# Patient Record
Sex: Male | Born: 1955
Health system: Southern US, Community
[De-identification: ages and names within clinical notes are randomized; demographics above are authoritative.]

## PROBLEM LIST (undated history)

## (undated) DIAGNOSIS — J309 Allergic rhinitis, unspecified: Secondary | ICD-10-CM

## (undated) DIAGNOSIS — L309 Dermatitis, unspecified: Secondary | ICD-10-CM

## (undated) DIAGNOSIS — S42442A Displaced fracture (avulsion) of medial epicondyle of left humerus, initial encounter for closed fracture: Secondary | ICD-10-CM

## (undated) DIAGNOSIS — I10 Essential (primary) hypertension: Secondary | ICD-10-CM

## (undated) DIAGNOSIS — I480 Paroxysmal atrial fibrillation: Secondary | ICD-10-CM

## (undated) DIAGNOSIS — K219 Gastro-esophageal reflux disease without esophagitis: Secondary | ICD-10-CM

## (undated) DIAGNOSIS — T7840XA Allergy, unspecified, initial encounter: Secondary | ICD-10-CM

## (undated) HISTORY — DX: Allergy, unspecified, initial encounter: T78.40XA

## (undated) HISTORY — DX: Displaced fracture (avulsion) of medial epicondyle of left humerus, initial encounter for closed fracture: S42.442A

## (undated) HISTORY — DX: Dermatitis, unspecified: L30.9

## (undated) HISTORY — DX: Essential (primary) hypertension: I10

## (undated) HISTORY — PX: OTHER SURGICAL HISTORY: SHX169

## (undated) HISTORY — DX: Allergic rhinitis, unspecified: J30.9

## (undated) HISTORY — PX: APPENDECTOMY: SHX54

## (undated) HISTORY — DX: Paroxysmal atrial fibrillation: I48.0

---

## 1999-06-28 ENCOUNTER — Emergency Department (HOSPITAL_COMMUNITY): Admission: EM | Admit: 1999-06-28 | Discharge: 1999-06-28 | Payer: Self-pay | Admitting: Emergency Medicine

## 2000-08-09 ENCOUNTER — Emergency Department (HOSPITAL_COMMUNITY): Admission: EM | Admit: 2000-08-09 | Discharge: 2000-08-09 | Payer: Self-pay | Admitting: Emergency Medicine

## 2000-08-09 ENCOUNTER — Encounter: Payer: Self-pay | Admitting: Emergency Medicine

## 2003-12-16 ENCOUNTER — Ambulatory Visit: Payer: Self-pay | Admitting: Family Medicine

## 2003-12-16 ENCOUNTER — Encounter: Admission: RE | Admit: 2003-12-16 | Discharge: 2003-12-16 | Payer: Self-pay | Admitting: Family Medicine

## 2004-04-10 ENCOUNTER — Emergency Department (HOSPITAL_COMMUNITY): Admission: EM | Admit: 2004-04-10 | Discharge: 2004-04-10 | Payer: Self-pay | Admitting: Emergency Medicine

## 2004-07-21 ENCOUNTER — Ambulatory Visit (HOSPITAL_COMMUNITY): Admission: RE | Admit: 2004-07-21 | Discharge: 2004-07-22 | Payer: Self-pay | Admitting: Specialist

## 2004-07-28 ENCOUNTER — Ambulatory Visit: Payer: Self-pay | Admitting: Family Medicine

## 2004-08-03 ENCOUNTER — Ambulatory Visit: Payer: Self-pay | Admitting: Family Medicine

## 2005-01-17 ENCOUNTER — Ambulatory Visit: Payer: Self-pay | Admitting: Gastroenterology

## 2005-01-26 ENCOUNTER — Ambulatory Visit: Payer: Self-pay | Admitting: Gastroenterology

## 2005-01-26 HISTORY — PX: COLONOSCOPY: SHX174

## 2005-05-30 ENCOUNTER — Ambulatory Visit (HOSPITAL_COMMUNITY): Admission: RE | Admit: 2005-05-30 | Discharge: 2005-05-30 | Payer: Self-pay | Admitting: Specialist

## 2005-06-13 ENCOUNTER — Ambulatory Visit (HOSPITAL_COMMUNITY): Admission: RE | Admit: 2005-06-13 | Discharge: 2005-06-13 | Payer: Self-pay | Admitting: Specialist

## 2005-08-06 ENCOUNTER — Ambulatory Visit: Payer: Self-pay | Admitting: Family Medicine

## 2005-08-17 ENCOUNTER — Ambulatory Visit: Payer: Self-pay | Admitting: Family Medicine

## 2005-09-11 ENCOUNTER — Ambulatory Visit (HOSPITAL_COMMUNITY): Admission: RE | Admit: 2005-09-11 | Discharge: 2005-09-12 | Payer: Self-pay | Admitting: Specialist

## 2006-07-25 DIAGNOSIS — I10 Essential (primary) hypertension: Secondary | ICD-10-CM

## 2006-07-25 DIAGNOSIS — J309 Allergic rhinitis, unspecified: Secondary | ICD-10-CM

## 2006-07-25 HISTORY — DX: Essential (primary) hypertension: I10

## 2006-08-06 ENCOUNTER — Ambulatory Visit: Payer: Self-pay | Admitting: Family Medicine

## 2006-08-06 LAB — CONVERTED CEMR LAB
AST: 23 units/L (ref 0–37)
Alkaline Phosphatase: 45 units/L (ref 39–117)
Basophils Relative: 0.5 % (ref 0.0–1.0)
Bilirubin, Direct: 0.1 mg/dL (ref 0.0–0.3)
Calcium: 9 mg/dL (ref 8.4–10.5)
Chloride: 101 meq/L (ref 96–112)
Cholesterol: 145 mg/dL (ref 0–200)
Creatinine, Ser: 1.2 mg/dL (ref 0.4–1.5)
Eosinophils Relative: 5.7 % — ABNORMAL HIGH (ref 0.0–5.0)
GFR calc Af Amer: 82 mL/min
HDL: 27.1 mg/dL — ABNORMAL LOW (ref >39.0)
Hemoglobin: 14.7 g/dL (ref 13.0–17.0)
LDL Cholesterol: 88 mg/dL (ref 0–99)
Lymphocytes Relative: 29.6 % (ref 12.0–46.0)
MCHC: 35 g/dL (ref 30.0–36.0)
Monocytes Absolute: 0.5 10*3/uL (ref 0.2–0.7)
Monocytes Relative: 8.2 % (ref 3.0–11.0)
Neutro Abs: 3.5 10*3/uL (ref 1.4–7.7)
Protein, U semiquant: NEGATIVE
RBC: 4.87 M/uL (ref 4.22–5.81)
RDW: 12.3 % (ref 11.5–14.6)
Sodium: 137 meq/L (ref 135–145)
TSH: 1.57 microintl units/mL (ref 0.35–5.50)
Total Bilirubin: 0.8 mg/dL (ref 0.3–1.2)
Total CHOL/HDL Ratio: 5.4
Total Protein: 6.3 g/dL (ref 6.0–8.3)
Triglycerides: 151 mg/dL — ABNORMAL HIGH (ref 0–149)
Urobilinogen, UA: 0.2
VLDL: 30 mg/dL (ref 0–40)
pH: 7

## 2006-08-13 ENCOUNTER — Ambulatory Visit: Payer: Self-pay | Admitting: Family Medicine

## 2007-05-22 ENCOUNTER — Ambulatory Visit: Payer: Self-pay | Admitting: Family Medicine

## 2007-05-22 DIAGNOSIS — J069 Acute upper respiratory infection, unspecified: Secondary | ICD-10-CM

## 2007-05-22 HISTORY — DX: Acute upper respiratory infection, unspecified: J06.9

## 2007-08-18 ENCOUNTER — Ambulatory Visit: Payer: Self-pay | Admitting: Family Medicine

## 2007-08-18 LAB — CONVERTED CEMR LAB
ALT: 26 units/L (ref 0–53)
Alkaline Phosphatase: 49 units/L (ref 39–117)
BUN: 20 mg/dL (ref 6–23)
Bilirubin, Direct: 0.1 mg/dL (ref 0.0–0.3)
Blood in Urine, dipstick: NEGATIVE
CO2: 29 meq/L (ref 19–32)
Cholesterol: 144 mg/dL (ref 0–200)
Eosinophils Relative: 5.6 % — ABNORMAL HIGH (ref 0.0–5.0)
Glucose, Bld: 109 mg/dL — ABNORMAL HIGH (ref 70–99)
Hemoglobin: 14.3 g/dL (ref 13.0–17.0)
Ketones, urine, test strip: NEGATIVE
LDL Cholesterol: 73 mg/dL (ref 0–99)
Lymphocytes Relative: 24.2 % (ref 12.0–46.0)
MCV: 88.2 fL (ref 78.0–100.0)
Monocytes Absolute: 0.6 10*3/uL (ref 0.1–1.0)
Monocytes Relative: 8.7 % (ref 3.0–12.0)
Neutro Abs: 4.3 10*3/uL (ref 1.4–7.7)
Neutrophils Relative %: 60.6 % (ref 43.0–77.0)
Nitrite: NEGATIVE
Protein, U semiquant: NEGATIVE
RBC: 4.69 M/uL (ref 4.22–5.81)
RDW: 12.3 % (ref 11.5–14.6)
Sodium: 140 meq/L (ref 135–145)
Specific Gravity, Urine: 1.025
TSH: 1.17 microintl units/mL (ref 0.35–5.50)
Total CHOL/HDL Ratio: 4.5
Total Protein: 6.1 g/dL (ref 6.0–8.3)
Triglycerides: 198 mg/dL — ABNORMAL HIGH (ref 0–149)
Urobilinogen, UA: 0.2
WBC: 7.1 10*3/uL (ref 4.5–10.5)
pH: 5.5

## 2007-08-24 ENCOUNTER — Encounter: Payer: Self-pay | Admitting: Family Medicine

## 2007-08-25 ENCOUNTER — Ambulatory Visit: Payer: Self-pay | Admitting: Family Medicine

## 2007-08-25 DIAGNOSIS — M25559 Pain in unspecified hip: Secondary | ICD-10-CM | POA: Insufficient documentation

## 2007-08-28 ENCOUNTER — Telehealth: Payer: Self-pay | Admitting: Family Medicine

## 2008-04-28 DIAGNOSIS — S60459A Superficial foreign body of unspecified finger, initial encounter: Secondary | ICD-10-CM | POA: Insufficient documentation

## 2008-05-06 ENCOUNTER — Ambulatory Visit: Payer: Self-pay | Admitting: Family Medicine

## 2008-08-12 ENCOUNTER — Ambulatory Visit: Payer: Self-pay | Admitting: Family Medicine

## 2008-08-12 DIAGNOSIS — G501 Atypical facial pain: Secondary | ICD-10-CM

## 2008-08-16 ENCOUNTER — Telehealth: Payer: Self-pay | Admitting: Family Medicine

## 2008-08-20 ENCOUNTER — Ambulatory Visit: Payer: Self-pay | Admitting: Family Medicine

## 2008-08-24 ENCOUNTER — Encounter: Admission: RE | Admit: 2008-08-24 | Discharge: 2008-08-24 | Payer: Self-pay | Admitting: Otolaryngology

## 2008-09-07 ENCOUNTER — Ambulatory Visit: Payer: Self-pay | Admitting: Family Medicine

## 2008-09-07 LAB — CONVERTED CEMR LAB
ALT: 26 units/L (ref 0–53)
AST: 24 units/L (ref 0–37)
Albumin: 3.9 g/dL (ref 3.5–5.2)
Alkaline Phosphatase: 50 units/L (ref 39–117)
Basophils Absolute: 0 10*3/uL (ref 0.0–0.1)
Blood in Urine, dipstick: NEGATIVE
CO2: 31 meq/L (ref 19–32)
Cholesterol: 147 mg/dL (ref 0–200)
Eosinophils Absolute: 0.3 10*3/uL (ref 0.0–0.7)
GFR calc non Af Amer: 73.99 mL/min (ref >60)
HDL: 36.3 mg/dL — ABNORMAL LOW (ref >39.00)
Hemoglobin: 14.5 g/dL (ref 13.0–17.0)
Ketones, urine, test strip: NEGATIVE
LDL Cholesterol: 91 mg/dL (ref 0–99)
Lymphocytes Relative: 30.9 % (ref 12.0–46.0)
Lymphs Abs: 1.5 10*3/uL (ref 0.7–4.0)
MCHC: 34.6 g/dL (ref 30.0–36.0)
Monocytes Absolute: 0.5 10*3/uL (ref 0.1–1.0)
Neutro Abs: 2.7 10*3/uL (ref 1.4–7.7)
Neutrophils Relative %: 52.5 % (ref 43.0–77.0)
Nitrite: NEGATIVE
Potassium: 5 meq/L (ref 3.5–5.1)
Protein, U semiquant: NEGATIVE
RDW: 12.1 % (ref 11.5–14.6)
Sodium: 140 meq/L (ref 135–145)
TSH: 1.31 microintl units/mL (ref 0.35–5.50)
Total Bilirubin: 0.9 mg/dL (ref 0.3–1.2)
Total CHOL/HDL Ratio: 4
Triglycerides: 100 mg/dL (ref 0.0–149.0)
Urobilinogen, UA: 0.2
VLDL: 20 mg/dL (ref 0.0–40.0)
WBC Urine, dipstick: NEGATIVE

## 2008-09-14 ENCOUNTER — Ambulatory Visit: Payer: Self-pay | Admitting: Family Medicine

## 2008-09-15 ENCOUNTER — Encounter (INDEPENDENT_AMBULATORY_CARE_PROVIDER_SITE_OTHER): Payer: Self-pay | Admitting: Otolaryngology

## 2008-09-15 ENCOUNTER — Ambulatory Visit (HOSPITAL_BASED_OUTPATIENT_CLINIC_OR_DEPARTMENT_OTHER): Admission: RE | Admit: 2008-09-15 | Discharge: 2008-09-15 | Payer: Self-pay | Admitting: Otolaryngology

## 2009-02-07 ENCOUNTER — Telehealth: Payer: Self-pay | Admitting: Family Medicine

## 2009-02-09 ENCOUNTER — Encounter: Payer: Self-pay | Admitting: Family Medicine

## 2009-07-04 IMAGING — CR DG HIP (WITH OR WITHOUT PELVIS) 2-3V*L*
3 series · 3 of 3 positions shown · non-contrast
Comparison: None

CLINICAL DATA: Left hip pain for 1 month.  No known injury.

LEFT HIP - COMPLETE 2+ VIEW

[view not recorded (1 of 3)]
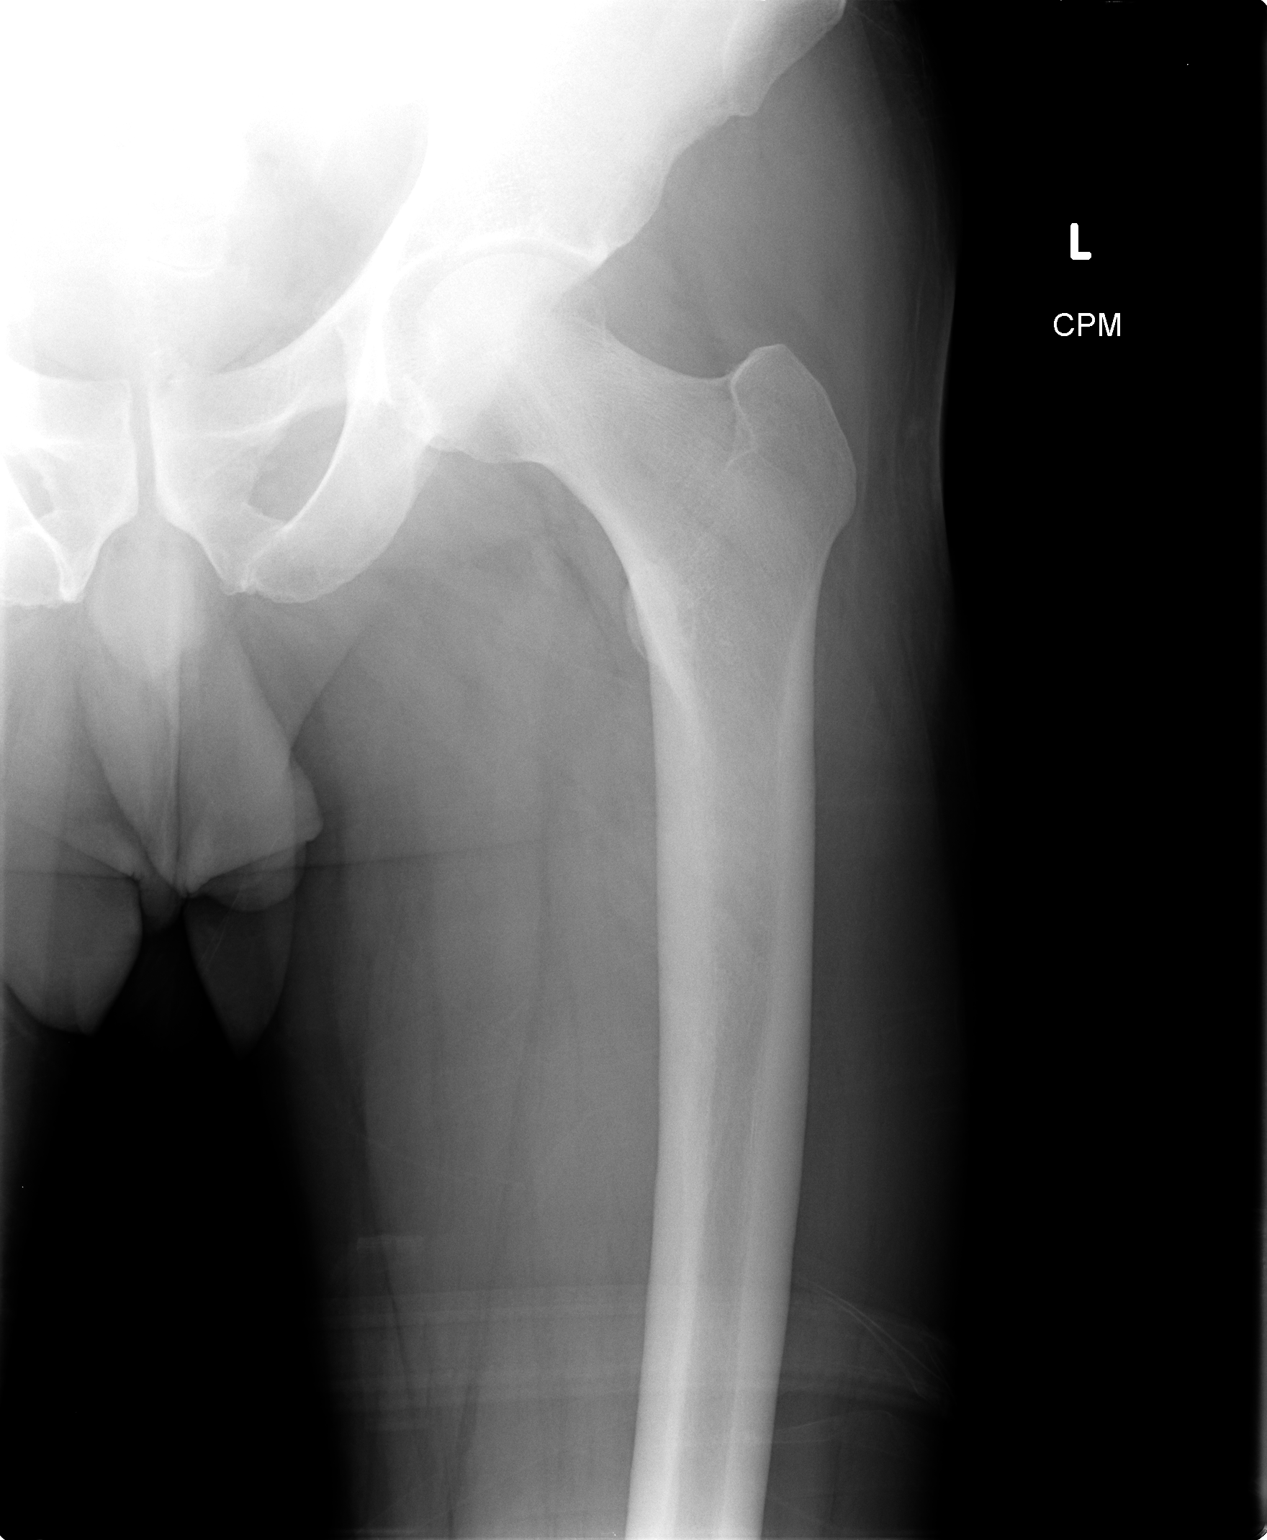

[view not recorded (2 of 3)]
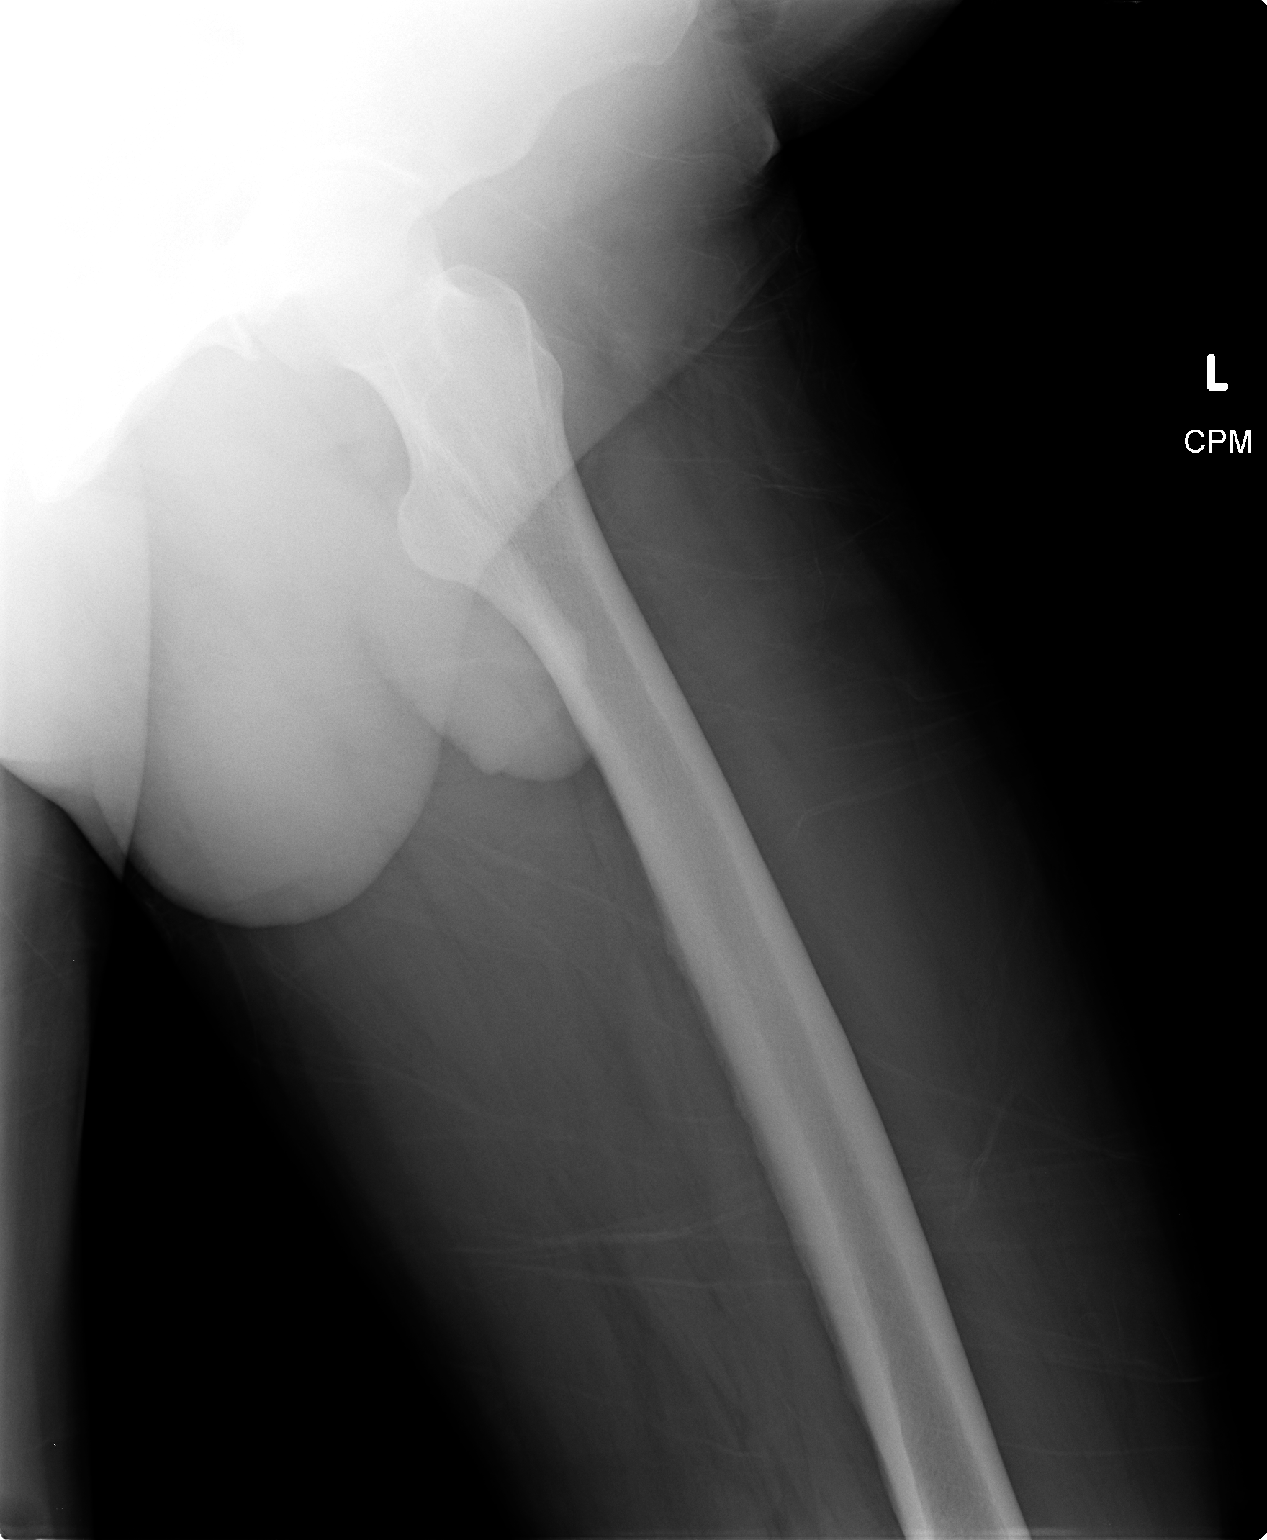

[view not recorded (3 of 3)]
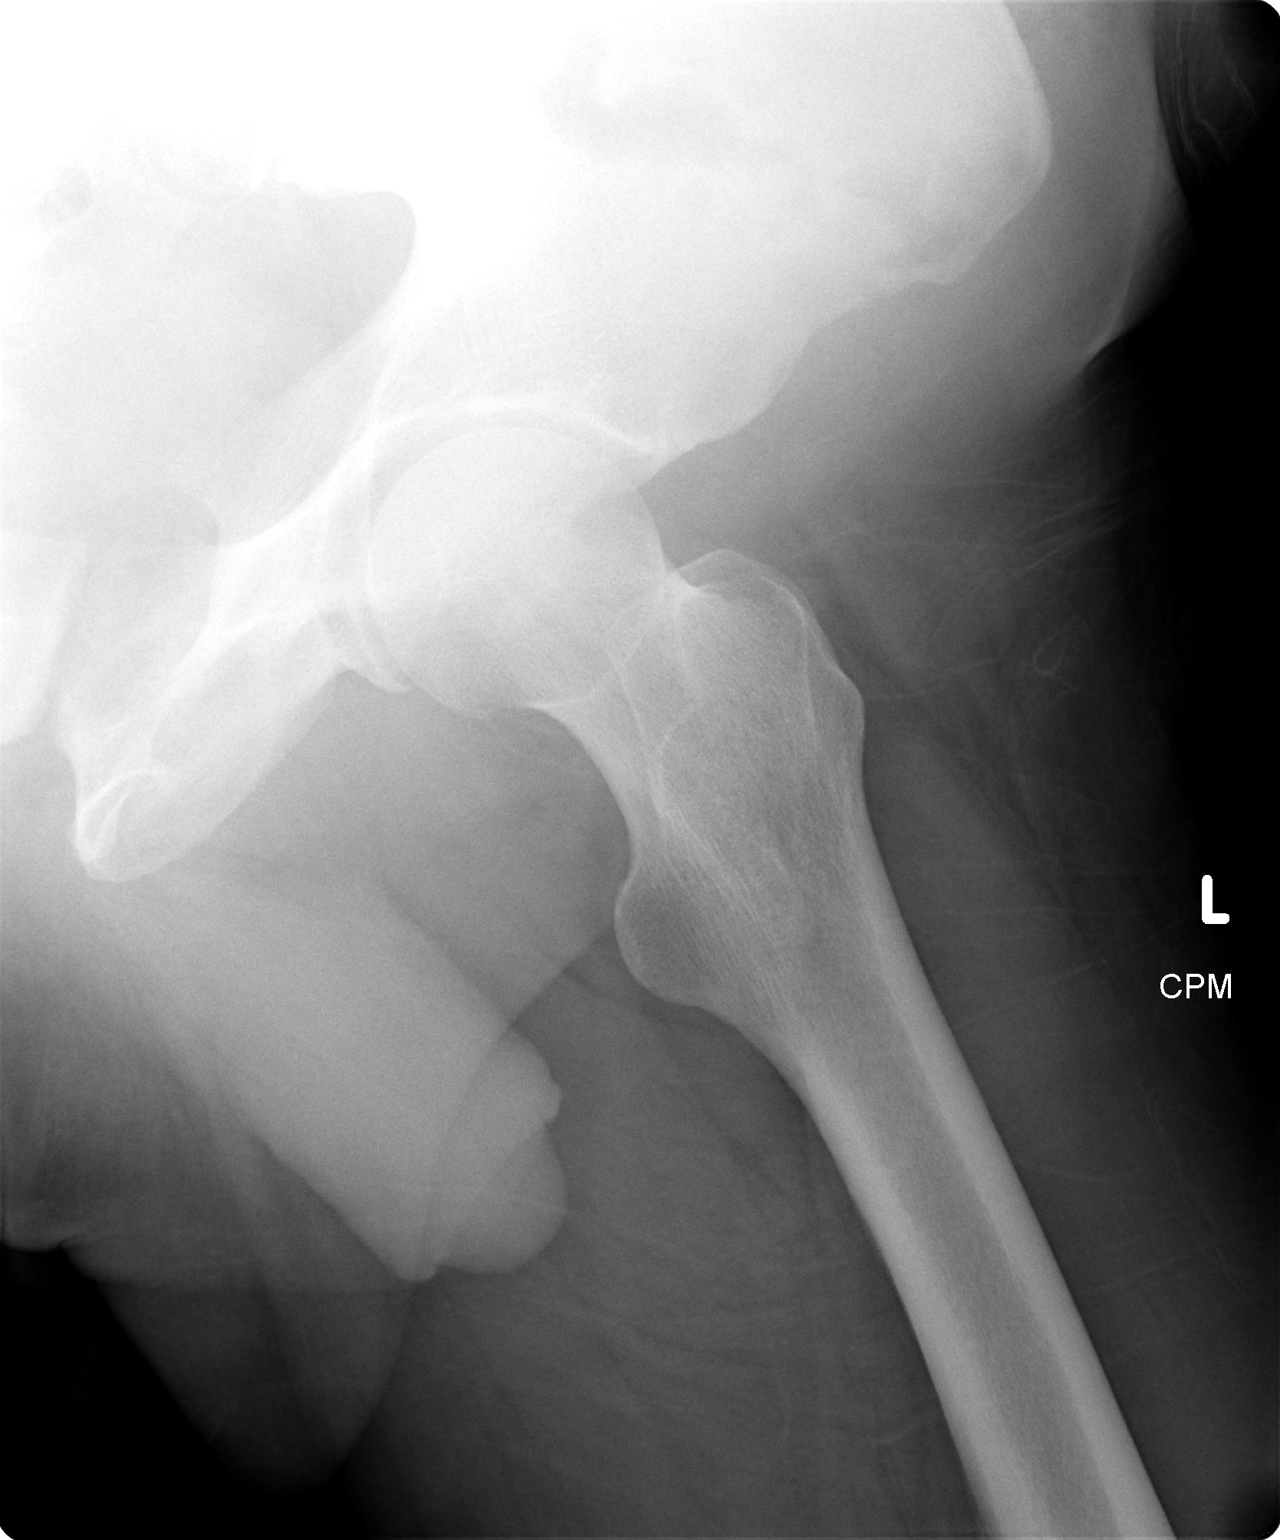

[3 of 3 positions shown; findings below may reference images not displayed]

FINDINGS: No acute bony findings or significant degenerative
changes.  No plain film findings for a fat necrosis.
IMPRESSION: 1.  No acute bony findings or significant degenerative changes.

## 2009-07-11 ENCOUNTER — Telehealth: Payer: Self-pay | Admitting: Family Medicine

## 2009-09-08 ENCOUNTER — Ambulatory Visit: Payer: Self-pay | Admitting: Family Medicine

## 2009-09-08 LAB — CONVERTED CEMR LAB
Basophils Absolute: 0 10*3/uL (ref 0.0–0.1)
Basophils Relative: 0.7 % (ref 0.0–3.0)
Blood in Urine, dipstick: NEGATIVE
Calcium: 9 mg/dL (ref 8.4–10.5)
Chloride: 104 meq/L (ref 96–112)
Creatinine, Ser: 1 mg/dL (ref 0.4–1.5)
Eosinophils Absolute: 0.3 10*3/uL (ref 0.0–0.7)
GFR calc non Af Amer: 82.29 mL/min (ref >60)
HCT: 41.1 % (ref 39.0–52.0)
HDL: 37.5 mg/dL — ABNORMAL LOW (ref >39.00)
Hemoglobin: 14.1 g/dL (ref 13.0–17.0)
Ketones, urine, test strip: NEGATIVE
Lymphocytes Relative: 30.2 % (ref 12.0–46.0)
MCV: 89.8 fL (ref 78.0–100.0)
Monocytes Absolute: 0.5 10*3/uL (ref 0.1–1.0)
Monocytes Relative: 9 % (ref 3.0–12.0)
Neutrophils Relative %: 54.1 % (ref 43.0–77.0)
Platelets: 147 10*3/uL — ABNORMAL LOW (ref 150.0–400.0)
RBC: 4.58 M/uL (ref 4.22–5.81)
Sodium: 138 meq/L (ref 135–145)
Specific Gravity, Urine: 1.025
Total Bilirubin: 0.7 mg/dL (ref 0.3–1.2)
Total CHOL/HDL Ratio: 4
Total Protein: 5.8 g/dL — ABNORMAL LOW (ref 6.0–8.3)
Urobilinogen, UA: 0.2

## 2009-09-15 ENCOUNTER — Ambulatory Visit: Payer: Self-pay | Admitting: Family Medicine

## 2010-02-05 ENCOUNTER — Encounter: Payer: Self-pay | Admitting: Specialist

## 2010-02-14 NOTE — Letter (Signed)
Summary: Generic Letter  [place] [facility]  [address] [place], [place] [address]   Phone: [phone]  Fax: [phone]    02/09/2009  VISHAAL STROLLO [address] [place], [place]  [address]  To Whom it May Concern,  Our patient is authorized to take the following over the counter medications:  Aspirin, Red yeast rice, Zyrtec, Motrin, Claritin, Claritin D, Prilosec, Niacin, Citrcal, and Geritol complete.  If you have any questions or concerns please feel free to call our office.           Sincerely,   Kelle Darting, MD

## 2010-02-14 NOTE — Progress Notes (Signed)
Summary: refill  Phone Note Refill Request Message from:  Fax from Pharmacy on July 11, 2009 1:53 PM  Refills Requested: Medication #1:  ATENOLOL 25 MG  TABS Take 1 tablet by mouth once a day Initial call taken by: Kern Reap CMA Duncan Dull),  July 11, 2009 1:53 PM    Prescriptions: ATENOLOL 25 MG  TABS (ATENOLOL) Take 1 tablet by mouth once a day  #100 x 0   Entered by:   Kern Reap CMA (AAMA)   Authorized by:   Roderick Pee MD   Signed by:   Kern Reap CMA (AAMA) on 07/11/2009   Method used:   [facility]  [address] [phone][facility] (retail[facility]       [address]       [place], [place]  [id number]       Ph: [phone] or [phone]       Fax: [phone]   RxID:   [id number]
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
## 2010-02-14 NOTE — Progress Notes (Signed)
Summary: Pt req letter Berkley Harvey over the counter meds   Phone Note Call from Patient Call back at Home Phone [phone] Call back at [phone] cell   Caller: Patient Summary of Call: Pt called and is req that Dr. Tawanna Cooler write a letter authorizing his over the counter medications. Pt is needing for Insurance purposes.  Initial call taken by: Lucy Antigua,  February 07, 2009 12:45 PM  Follow-up for Phone Call        Fleet Contras please call to clarify the issues Follow-up by: Roderick Pee MD,  February 07, 2009 3:21 PM  Additional Follow-up for Phone Call Additional follow up Details #1::        letter mailed  Additional Follow-up by: Kern Reap CMA Duncan Dull),  February 09, 2009 1:42 PM    New/Updated Medications: ZYRTEC HIVES RELIEF 10 MG TABS (CETIRIZINE HCL) once daily MOTRIN IB 200 MG TABS (IBUPROFEN) take one tab two times a day CLARITIN 10 MG TABS (LORATADINE) take one tab once daily as needed CLARITIN-D 24 HOUR 10-240 MG XR24H-TAB (LORATADINE-PSEUDOEPHEDRINE) take one tab once daily PRILOSEC 20 MG CPDR (OMEPRAZOLE) once daily CITRACAL PLUS  TABS (MULTIPLE MINERALS-VITAMINS) once daily GERITOL COMPLETE  TABS (IRON-VITAMINS) once daily

## 2010-02-20 ENCOUNTER — Telehealth: Payer: Self-pay | Admitting: *Deleted

## 2010-02-20 DIAGNOSIS — I10 Essential (primary) hypertension: Secondary | ICD-10-CM

## 2010-02-20 NOTE — Telephone Encounter (Signed)
Pt is asking for Cozaar back as his BP is running up in the 150's /80's [facility] [address].

## 2010-02-21 ENCOUNTER — Telehealth: Payer: Self-pay | Admitting: *Deleted

## 2010-02-21 ENCOUNTER — Other Ambulatory Visit: Payer: Self-pay | Admitting: *Deleted

## 2010-02-21 DIAGNOSIS — I10 Essential (primary) hypertension: Secondary | ICD-10-CM

## 2010-02-21 MED ORDER — LOSARTAN POTASSIUM 50 MG PO TABS: 50.0000 mg | ORAL_TABLET | Freq: Every day | ORAL | Status: DC

## 2010-02-21 NOTE — Telephone Encounter (Signed)
Returning Rachel's call

## 2010-02-21 NOTE — Telephone Encounter (Signed)
Spoke with patient.

## 2010-02-21 NOTE — Telephone Encounter (Signed)
First rx printed in error

## 2010-02-21 NOTE — Telephone Encounter (Signed)
Okay to restart the Cozaar, question dose???????? Dispense hundred tabs directions one daily.  Refills x 3.  Asked him to take a blood pressure daily in the morning.  If after 3 weeks.  His blood pressure is normal, then continue the current dose.  If, however, blood pressures not normal, come see [place] with the data and the device for reevaluation

## 2010-02-21 NOTE — Telephone Encounter (Signed)
Spoke with patient and rx sent 

## 2010-02-21 NOTE — Telephone Encounter (Signed)
Left message on machine for patient   Returning his call.

## 2010-04-22 LAB — BASIC METABOLIC PANEL
BUN: 17 mg/dL (ref 6–23)
Chloride: 101 mEq/L (ref 96–112)
Creatinine, Ser: 0.97 mg/dL (ref 0.4–1.5)
GFR calc Af Amer: >60 mL/min (ref >60)
GFR calc non Af Amer: >60 mL/min (ref >60)
Glucose, Bld: 88 mg/dL (ref 70–99)
Potassium: 4 mEq/L (ref 3.5–5.1)

## 2010-05-30 NOTE — Op Note (Signed)
NAME:  Alvin Morrison, Alvin Morrison              ACCOUNT NO.:  [id number]   MEDICAL RECORD NO.:  [id number]          PATIENT TYPE:  AMB   LOCATION:  [facility]                          FACILITY:  [facility]   PHYSICIAN:  Kristine Garbe. Ezzard Standing, M.D.DATE OF BIRTH:  01-19-1955   DATE OF PROCEDURE:  09/15/2008  DATE OF DISCHARGE:                               OPERATIVE REPORT   PREOPERATIVE DIAGNOSES:  Chronic right maxillary right ethmoid sinus  disease.  Septal deviation to the right with turbinate hypertrophy.   POSTOPERATIVE DIAGNOSES:  Chronic right maxillary right ethmoid sinus  disease.  Septal deviation to the right with turbinate hypertrophy.   OPERATION PERFORMED:  Septoplasty.  Posterior endoscopic sinus surgery  with total right ethmoidectomy.  Right maxillary ostial enlargement with  irrigation of right maxillary sinus.  Simple bilateral inferior  turbinate reductions.   SURGEON:  Kristine Garbe. Ezzard Standing, MD   ANESTHESIA:  General endotracheal.   COMPLICATIONS:  None.   BRIEF CLINICAL NOTE:  Dictation ended at this point.           ______________________________  Kristine Garbe. Ezzard Standing, M.D.     CEN/MEDQ  D:  09/15/2008  T:  09/16/2008  Job:  [id number]   cc:   Tinnie Gens A. Tawanna Cooler, MD
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
## 2010-06-02 NOTE — Op Note (Signed)
[place]  Patient:    Alvin Morrison, Alvin Morrison                     MRN: [id number] Proc. Date: 06/28/99 Adm. Date:  [id number] Disc. Date: [id number] Attending:  Lorre Nick                           Operative Report  PREOPERATIVE DIAGNOSES: 1. A 5.5 cm complex upper lip laceration with abrasions secondary to dog bite. 2. Multiple facial abrasions secondary to dog bite.  POSTOPERATIVE DIAGNOSES: 1. A 5.5 cm complex upper lip laceration with abrasions secondary to dog bite. 2. Multiple facial abrasions secondary to dog bite.  PROCEDURES: 1. Debridement and repair of a 5.5 cm complex upper lip laceration and    abrasions. 2. Debridement of facial abrasions.  SURGEON:  Mary A. Contogiannis, M.D.  ASSISTANT:  ANESTHESIA:  One percent lidocaine with epinephrine.  COMPLICATIONS:  None.  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  As a result, he has a complex stellate laceration to upper lip along with abrasions in that area.  He also has abrasions on the lower lip and chin area.  I was consulted by the ER staff to evaluate and repair the lacerations.  DESCRIPTION OF PROCEDURE:  The patient was lying on the stretcher in the ER. The face was prepped with Betadine and draped in a sterile fashion.  The skin and subcutaneous tissues around the area of the upper lip laceration and abrasions were then injected with 1% lidocaine with epinephrine.  After adequate hemostasis and anesthesia had taken effect, the procedure was begun. The laceration was complex and did go into the orbicularis oris musculature. The laceration was stellate.  Many of the edges were contused and abraded. There were actually a few small skin flaps as well.  All of this area of laceration again was stellate with many arms to it.  The  laceration was sharply debrided and then irrigated thoroughly with saline irrigation.  The wound was closed in multiple layers, first reapproximating the orbicularis oris musculature with 3-0 interrupted Monocryl sutures.  Next the subcutaneous and subdermal layers were repaired using 4-0 and 5-0 Monocryl as appropriate. The skin edges were then closed using 6-0 Prolene in a running baseball stitch, as well as interrupted sutures.  There were some areas of abrasion adjacent to this area which were debrided.  The patient also had abrasion-type lacerations on the lower lip and upper chin area which were also debrided. The repaired lacerations were then dressed with bacitracin ointment.  There were no complications.  The patient tolerated the procedure well.  As I discussed with the patient at the time of surgery, part of the skin edges were very contused and there were actually a couple of areas of small skin flaps. These were placed back down and sewn in place and hopefully they will take. It is possible, however, that they may not take as well.  The patient was discharged home on p.o. Augmentin.  A follow-up appointment will be tomorrow in the office. DD:  06/28/99 TD:  07/03/99 Job: [id number]

## 2010-06-02 NOTE — Op Note (Signed)
NAME:  Alvin Morrison, Alvin Morrison              ACCOUNT NO.:  [id number]   MEDICAL RECORD NO.:  [id number]          PATIENT TYPE:  INP   LOCATION:  5005                         FACILITY:  [facility]   PHYSICIAN:  Kerrin Champagne, M.D.   DATE OF BIRTH:  [birth year]   DATE OF PROCEDURE:  09/11/2005  DATE OF DISCHARGE:                                 OPERATIVE REPORT   PREOPERATIVE DIAGNOSIS:  Nonunion, left mid shaft humerus fracture status  post intramedullary nail, status post dynamization of intramedullary nail.   POSTOPERATIVE DIAGNOSES:  Nonunion, left mid shaft humerus fracture status  post intramedullary nail, status post dynamization of intramedullary nail.   PROCEDURE:  Left upper extremity removal of humeral IM nail, then takedown  of a nonunion left mid shaft humerus fracture, and ORIF of left humerus  fracture with an anterior 6-hole 3.5 DCP and a lateral 6-hole 3.5 DCP with  local bone graft.   SURGEON:  Dr. Vira Browns.   ASSISTANT:  Wende Neighbors, P.A.C.   ANESTHESIA:  General via orotracheal intubation, Dr. Diamantina Monks.  Local  infiltration with Marcaine over the upper shoulder incision over the lateral  arm.   SPECIMENS:  Nonunion material sent for culture and sensitivity.   ESTIMATED BLOOD LOSS:  50 mL   COMPLICATIONS:  None.   TOTAL TOURNIQUET TIME:  250 mmHg, 1 hour 37 minutes.   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  He was treated initially conservatively for both a mid shaft  humerus fracture and a medial condyle fracture which was nondisplaced.  The  fracture was treated with coaptation splint, eventually transferred to a  functional brace.  The medial condyle fracture went on to heal uneventfully.  The mid shaft humerus fracture continued with motion to fracture site  despite the use of functional bracing, with excellent return of function  of  the left elbow.  He was taken to the operating room, underwent an open  reduction and internal fixation with an intramedullary nail using both  proximal and distal interlocking screws.  After a period of 6 months, this  did not seem to allow for healing at the fracture site.  He underwent  dynamization of the rod with removal of both proximal and distal  interlocking screws.  This also was unsuccessful in eliciting strong bony  healing response.  He is now nearly 56 months out from the time of removing  his interlocking screws without any evidence of significant improvement in  bone stability.  He is brought back to the operating room today to undergo  removal of the intermedullary nail, takedown nonunion of the humerus with  plate fixation and bone grafting.  Initially planned for iliac crest bone  graft on the left side.  At the time of surgery, he was found to have enough  abundant bone material that was resected at the nonunion site to allow for  bone grafting locally.   DESCRIPTION OF PROCEDURE:  After adequate general  anesthesia, the patient  had a roll placedunder the left buttocks, a roll placed under the left  scapula and shoulder, elevating him on the left side in a supine position.  A hand table was used.  Standard prep with DuraPrep solution over the left  iliac crest anterolaterally and over the left parascapular region, axillary  area around the shoulder and the base of the neck laterally on the left  side, on the left circumferential arm and elbow and forearm to the wrist.  He was draped in the usual manner.  Iodine VI-Drape was used to exclude the  axillary area in the upper arm. VI-Drape was used over the left iliac crest.   An initial incision was made over the left upper arm in line with the  previous incision used for placing the IM nail proximally.  This is an  incision just off the lateral aspect of the acromion, approximately 3-4 cm  in length, through the  skin and subcutaneous layers, directly down to the  deltoid.  This was split in line with the previous scar tissue, down to the  subdeltoid bursa.  The rotator cuff was identified.  The interval previous  incision previous sutures were removed and an incision made into the rotator  cuff in line with its fibers.  A Hemostat was then used to localize the  opening at the medial border of the greater tuberosity at the osteochondral  border of the humeral head laterally where the nail was present.  Curettage  then performed in order to expose the proximal end of the nail.  With  adduction of the arm and slight anterior flexion, the nail was easily  brought into line with the opening.  Then using a tapered, threaded, easy-  out type of Universal nail extractor, the nail was able to be extracted  approximately 5 mm without removal of the inner medullary portion or the  internal cap.  We were able to then insert an insertion guide pin and  further extract the nail an additional 2-3 cm, then able to grasp it with a  pair of pliers, and then carefully toggling the nail and carefully backing  it out without difficulty.  The nail was then removed.  Irrigation was  performed in the rotator cuff area in the shoulder joint.   Inspection demonstrated the anterior and posterior flaps of rotator cuff to  be in good condition.  These were approximated with interrupted 0 and #1  Ethibond sutures in a simple fashion.  Deltoid was then re-approximated in  the subcu areas with interrupted 0 Vicryl sutures.  The subcu layer was  approximated with interrupted 2-0 Vicryl sutures and stainless steel staples  then used to close the incision over the left lateral shoulder.  Next, the  left lateral arm was then approached.   The old previous incision scar at the interval of the biceps muscle and  brachialis laterally, about the mid portion of the humerus incision, then to be drawn and in line with the lateral aspect  of the humerus and carried  along the anterior border of the deltoid proximally and along the anterior  border of the brachioradialis muscle distally.  The arm had a sterile  tourniquet placed about the upper arm.  Protecting the skin with  stockinette.  Exsanguination with Esmarch bandage, tourniquet inflated to  250 mmHg.   An incision approximately 6 inches in length along the lateral aspect of the  humerus through the skin and subcutaneous  layers down to  the brachialis  muscle.  This was incised in line with the previous incision scar, then  carried down to the lateral aspect of the humerus over the mid shaft of the  humerus.  The fracture site identified, the incision then carried distally  through the brachialis muscle, continuing distally to near the area of the  brachialis muscle.  Here, Metzenbaum scissors were then used to carefully  spread the tissues to expose the radial nerve in its distal approach to the  anterolateral aspect of the humerus.  Nerve was identified and protected  here.  Subperiosteal dissection was then carried about the fracture side,  exposing both the proximal and distal ends of the humeral fracture site.  Abundant calluses present.  There was also abundant scar tissue interposed  at the fracture site.  This was debrided circumferentially.  Medially, the  vasculature and blood vessels were protected.   The fracture did not appear to interdigitate with any anatomic alignment.  Therefore, the expected anterior surface of the humerus was carefully lined  up and electrocautery used to make a line for alignment and rotation.  This  was then marked further with an indelible ink marker pen in order to  determine rotation.  Next, carefully the ends of the humeral fracture site  were carefully rongeured in order to allow for them to meet and to  interdigitate with good bone-on-bone contact.  There was a slight oblique  fragment extending medially and this was  carefully trimmed and smoothed, as  was the more proximal fracture end fragments in order to set this area and  allow for good bone-on-bone contact.  Excellent bone-on-bone contact was  obtained medial, anterior, lateral, and posterior.  The fracture, however,  had some slight apex lateral angular deformity, about 10 degrees maximum.  With this, then a 6-hole 4.5 DCP was applied to the anterior surface of the  humerus fracture site and three holes proximal and three holes distal.  Large bone holding forceps including bougied distally and bone holding  forceps proximally were used to hold the blade to bone and reduce the  fracture.  Three proximal screw holes were then filled, each drilled with  3.5 drill bit measured for depth and appropriate screw placed, obtaining  excellent fixation of plate proximally.   Next, the plate and the fracture site were carefully aligned and then fixing the distal fragment to the distal portions of the plate.  C-arm fluoro was  used to ascertain the reduction, indicating some slight apex lateral angular  deformity but excellent bone-on-bone contact.  We accepted the small degree  of angular deformity lateral because of the excellent bone-on-bone contact.  With this, then drill holes were placed distally and DCP action of the plate  were used by performing drill holes in the more distal portion of the screw  holes.  Then 3.5 screw holes were placed, first more proximal to the three  distal holes, and then appropriate screws were placed, obtaining DCP action  across the fracture site each, loosening the previous screw and placing the  next more distal screw and obtaining even further DCP action with each  successive screw.  With this, then the plate was nicely fixed to the  anterior surface of the humerus and the AP plane.  Patient had minimal apex  lateral angular deformity.  A rongeur was used to carefully rongeur the  lateral surface of the humerus near the  fracture site to decrease some of  the lateral bow here.  I obtained further bone graft that was interdigitated  in the fracture site which was very little room.  However, this was still  intermixed as best as possible.  A 6-hole 3.5 DCP plate was then contoured  to the lateral aspect of the humerus, right against this lateral surface,  and then drill holes placed, making sure that each of the screws carefully  crossed between the holes of the previous EPUs anteriorly.  Then 3.5 screws  were used to fix this plate to the lateral aspect of the humerus, obtaining  excellent fixation in both the anterior and lateral planes.   Additional bone graft that had been removed with the debridement of the  nonunion site was then carefully applied circumferentially about the area of  the fixed humerus.  The tourniquet was released.  Careful inspection showed  some very small bleeders that were controlled with bipolar electrocautery  along the anterior aspect to the brachialis muscle.   Irrigation had been performed prior to bone grafting to assure that the bone  graft remained in the present position of alignment.  Medium Hemovac drain  placed in the depth of the incision, exiting anterolaterally over the  proximal arm, distal to the deltoid.  The superficial fascial layer of the  brachialis muscle then used to approximate the brachialis muscle with  interrupted 0 Vicryl sutures.  The subcu layer was approximated with  interrupted 2-0 Vicryl suture, and the skin closed with stainless steel  staples.  Adaptic 4 x 4's, ABD pads fixed to the skin with Webril, and then  a well-padded long-arm bulky dressing was applied from the left hand to the  left upper arm.  Adaptic 4 x 4 fixed to the skin over the left shoulder  area.  The patient was then placed into a sling, reactivated, returned to  the recovery room in satisfactory condition.  All instrument and sponge  counts were correct.      Kerrin Champagne, M.D.  Electronically Signed     JEN/MEDQ  D:  09/11/2005  T:  09/11/2005  Job:  [id number]

## 2010-06-02 NOTE — Op Note (Signed)
NAME:  Alvin Morrison, Alvin Morrison              ACCOUNT NO.:  [id number]   MEDICAL RECORD NO.:  [id number]          PATIENT TYPE:  OIB   LOCATION:  5015                         FACILITY:  [facility]   PHYSICIAN:  Kerrin Champagne, M.D.   DATE OF BIRTH:  1955/11/16   DATE OF PROCEDURE:  07/21/2004  DATE OF DISCHARGE:                                 OPERATIVE REPORT   PREOPERATIVE DIAGNOSIS:  Nonunion left distal and middle 1/3 humeral shaft  fracture, healed medial condyle fracture, nondisplaced, involving the left  elbow.   POSTOPERATIVE DIAGNOSIS:  Nonunion left distal and middle 1/3 humeral shaft  fracture, healed medial condyle fracture, nondisplaced, involving the left  elbow.   PROCEDURE:  Open reduction and internal fixation of left distal middle 1/3  humeral shaft fracture nonunion using a Stryker 280 mm by 8 mm humeral  trauma nail with proximal and distal interlocking screws.   SURGEON:  Kerrin Champagne, M.D.   ASSISTANT:  Wende Neighbors, P.A.-C.   ANESTHESIA:  GOT, Dr. Diamantina Monks.   ESTIMATED BLOOD LOSS:  None.   DRAINS:  None.   TOURNIQUET TIME:  Left upper extremity 250 mmHg 25 minutes.   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  He was seen initially in the emergency room  at [facility], placed into a coaptation splint, followed up in the office  demonstrating adequate alignment of the humeral shaft fracture, was treated  with a functional splint for the proximal fracture site and the distal  middle 1/3 of the humerus and treated with first, splinting, then an elbow  hinged brace with a functional brace for the humerus.  Over the last two  months, he has developed persistent motion at the humeral  fracture site  involving the shaft and has gone on to heal the medial condyle fracture  site.  He is brought to the operating room after a follow up evaluation  demonstrated the fracture fragments to be widely displaced with developing a  flask like calcifications indicating a developing nonunion.   OPERATIVE FINDINGS:  As above.   DESCRIPTION OF PROCEDURE:  The patient was placed in a Shling shoulder frame  following general anesthetic, standard preoperative antibiotics, placed into  a beach chair position.  The patient underwent a standard prep with DuraPrep  solution from the left wrist to the left axillary region and circumferential  about the entire arm over the anterior proximal chest over the superior  aspect of the shoulder and lateral neck and over the posterior scapular  region and axillary area.  It was draped in the usual manner.  Iodine Vi-  Drape was used over the axillary area at its approximation to the drapes.  The patient initially had a sterile tourniquet about the left upper arm  placed and inflated  to 250 mmHg.  An incision was made at the interval of  the distal middle 1/3 of the humerus anterolaterally through the skin and  subcutaneous layers incising the superficial fascia between the brachialis  muscle and the biceps muscle laterally and then blunt dissection down to  bone using Metzenbaum scissors.  Here, callus was encountered, this was  spread using Metzenbaum scissors at the fracture site debriding soft tissue  and scar tissue interposed between the two fractured ends.  There was  apparent small amounts of fibrocartilage present that suggested the early  development of nonunion.  The distal intramedullary canal was identified  using a curet and opened with a curet.  The proximal canal was identified.  Note, the tourniquet required removal for further exposure of the proximal  fracture fragment ends to allow for approximation of the fracture fragment  ends.   An incision was then made over the lateral aspect of the left  shoulder extending from the acromion process laterally distally in line with  the deltoid incision approximately 4 to 4.5 cm in length through the skin  and subcutaneous layers incising the patient's deltoid muscle over the  greater tuberosity laterally and extending down to the subacromial space.  The greater tuberosity was identified, self-retaining retractor was placed.  An incision was then made in line with the patient's supraspinatus tendon  and rotator cuff directly over the greater tuberosity extending proximally.  This area was then spread and an awl introduced medial to the greater  tuberosity by about 0.5 cm and lateral to the biceps tendon.  It was entered  into the humeral head in line with the humeral shaft.  A guide pin was then  passed through this crossing the intramedullary canal proximally and then  across the fracture site.  Placing the guide pin across the fracture site  was not easy and required bending the tip of the intramedullary guide pin  then manipulating the fracture site and eventually opening the distal  intramedullary canal further until it accepted the guide pin.  Once the pin  was passed, reaming could be begun, first an initial measurement was  obtained for approximation of length measuring slightly greater than 285 mm.  Initially, 270 mm length nail was chosen, however, this nail ended up  having difficulties with insertion and removal of the proximal compression  screw causing the nail to be abandoned.  A 280 mm length by 8 mm nail was  chosen.  Reaming was begun at 7.5 mm and brought up to 8.5 then 9.5 mm in  0.5 mm increments from 8.5 mm.  Reaming was performed all the way to the tip  of the intramedullary guide pin which was about 2 cm above the olecranon  fossa of the intramedullary canal.  Once this was completed, then exchange was made of the intramedullary guide pin using a plastic  sleeve, placing the  more thin intramedullary guide for placement of the IM nail.  An 8 mm by 280  mm nail was then inserted into place and impacted down obtaining good  approximation of the fracture site.  Due to the nature of the obliquity of  the fracture site and some degree of displacement that was residual, it was  felt that compression was necessary.  In order to perform this, then, distal  interlocking screw was necessary and this was done by carefully bringing in  the C-arm fluoroscopy under direct visualization determining the proper  orientation of the distal interlocking screw  hole.  The screw hole to be  used was in the anterior posterior plane, a hemostat was used to mark the  skin overlying the opening of the distal end of the locking hole.  A stab  incision was made using a 15 blade scalpel through the skin and subcu layers  and a hemostat used to spread the deep layers down to bone.  With this,  then, the 4 mm screw drill bit which was a 3.5 drill bit was placed against  the anterior cortex and using the C-arm fluoroscopy used to perform free  hand drilling through the anterior cortex through the nail and then across  the posterior cortex.  After crossing the posterior cortex, length was  obtained at 25 mm.  The screw that attached to a screw driver that was able  to hold a distal interlocking screw was placed free hand and the screw to  cross the distal interlocking nail screw hole capturing both the anterior  and posterior cortex of the distal humerus.  This completed fixation  distally.  Returning proximally, the sleeve used to place the nail was then  used for placement of a guide for the proximal interlocking screw.  This was  then placed, counter incision made, and this was passed down to bone bluntly  using a hemostat again to spread soft tissues down to bone.  Again, the  proximal humerus was drilled and a 45 mm screw chosen which was partially  threaded.  This was  then passed and provided excellent fixation proximally.  This was placed within the [employer] screw hole.  An attempt was then made to  perform compression proximally and the compression screw was placed into the  proximal portion of the nail after removal of the insertion sleeve.  After  attempts at trying to obtain compression, this was abandoned and it was felt  that the screw had most likely stripped into its position alignment and no  longer was maintaining any compression.  However, the fracture site itself  appeared to be well approximated in both AP and lateral planes and  demonstrated excellent rotation and stability.  Attention was turned to  irrigation of all the incisions and closure.  The area of the incision in  the rotator cuff was approximated with interrupted #1 Ethibond sutures.  The  deltoid muscle and superficial fascia was reapproximated with interrupted 0 Vicryl sutures, the more superficial layers with interrupted 2-0 Vicryl  sutures, and the skin closed with stainless steel staples.  The stab  incision for the proximal interlock screw was closed with subcu sutures of 2-  0 Vicryl and skin with staples.  The lateral incision used for ORIF and open  reduction of the humerus fracture site was irrigated, the fracture layer at  the interval between the brachialis muscle and the biceps muscle  reapproximated with interrupted 0 and 2-0 Vicryl sutures used to close the  subcu layers and the skin was closed with stainless steel staples.  A stab  incision was closed in the subcu layers with interrupted 2-0 Vicryl sutures  and standard steel staples.  4 by 4s were affixed to the skin with hypofix  tape over Adaptic and then a well padded posterior splint applied from the  left hand to the upper arm.  The patient was then reactivated, extubated,  and returned to the recovery room in satisfactory condition.  All instrument  and sponge counts were correct.  Note that permanent C-arm  images were  obtained in the AP and lateral planes, fracture site, as well as the  proximal humerus to document fixation here.       JEN/MEDQ  D:  07/21/2004  T:  07/21/2004  Job:  [id number]

## 2010-06-02 NOTE — Op Note (Signed)
NAME:  Alvin Morrison, Alvin Morrison              ACCOUNT NO.:  [id number]   MEDICAL RECORD NO.:  [id number]          PATIENT TYPE:  AMB   LOCATION:  SDS                          FACILITY:  [facility]   PHYSICIAN:  Kerrin Champagne, M.D.   DATE OF BIRTH:  August 03, 1955   DATE OF PROCEDURE:  06/13/2005  DATE OF DISCHARGE:  06/13/2005                                 OPERATIVE REPORT   PREOPERATIVE DIAGNOSIS:  Draining incision left lateral arm status post  application of hydroxyapatite to humeral fracture site.   POSTOPERATIVE DIAGNOSIS:  Draining incision left lateral arm status post  application of hydroxyapatite to humeral fracture site.   PROCEDURE:  Incision excisional debridement of open wound left lateral arm,  status post placement of hydroxyapatite to the left humerus fracture site  with primary closure over TLS drain.   SURGEON:  Kerrin Champagne, M.D.   ASSISTANT:  None.   ANESTHESIA:  Maren Beach, M.D.   ESTIMATED BLOOD LOSS:  15 mL.   SPECIMENS:  Tissue sent for culture and sensitivity as well as swabs for  both anaerobic and aerobic cultures and sensitivities.   COMPLICATIONS:  None.   DRAINS:  The patient had a drain placed, 10-French TLS drain to red top too.  The patient returned to the PACU in good condition.   HISTORY OF PRESENT ILLNESS:  The patient is a 55 year old male who had a  left mid distal humeral shaft fracture associated with medial condyle  fracture left distal humerus, occurred nearly a year ago.  He was treated,  initially, with functional brace and went on to heal his medial condyle  fracture, but he persisted with motion at the fracture site of his distal  middle 1/3 of his humerus.  This was treated, initially, with functional  bracing and demonstrated no healing after several months; and then went on  to delayed and nonunion.  He was treated with an open reduction with IM  nailing using both proximal and distal interlocking screws.  He formed  callus,  gradually, over the medial posterior aspects of fracture site but  still had difficulty going on to heal the lateral aspect the fracture site  still with gross motion.  Because of this, he was taken the operating room 2  weeks ago; and underwent removal of both proximal and distal interlocking  screws to dynamize the fracture site; and then allow it to have compression  and go on to heal.  At the time of the surgery he had an incision made; and  a blunt dissection down to the fracture site, to have application of striker  hydroxyapatite material, to try and allow for osteoconductive healing; and  to enhance healing at the fracture site.  On return follow up the patient  was noted to have some persistent drainage from his incision; and it  remained slightly open.  This was dressed and over the course of the 2  weeks; he has continued with drainage here.  Yesterday he underwent culture  and sensitivity; and was brought in the operating room, today, to undergo  excisional debridement of the  open wound down to the fracture site with  closure over drain.   The results of his culture, thus far, is no growth at 1 day, and no  organisms seen on Gram stain.  The patient's white cell count is normal; and  he is afebrile.  He has no constitutional symptoms to suggest infection.   INTRAOPERATIVE FINDINGS:  No gross purulence noted at the site of the wound  that was opened down to the fracture site.  No purulent drainage noted, no  erythema.  The patient had areas of hydroxyapatite within the soft tissues  from the skin all the way down to the fracture site.  This apparently caused  enough irritation to likely be causing some persistent drainage.  Cultures  and sensitivity of the open wound site down to the bone area were sent for  both anaerobic and aerobic cultures and sensitivity.  The entire skin area  that was resected down to bone was sent for tissue culture.   DESCRIPTION OF PROCEDURE:  After  adequate general anesthesia, the left upper  extremity prepped from the wrist to the axillary area with DuraPrep  solution, draped in the usual manner.  The incision was made using a 15-  blade scalpel, ellipsing the old incision which was approximately 1-to-1.5  cm in length; and taking this down through the subcu layers down to the  patient's muscle layers, deep to the superficial fascia.  This was sent then  for culture and sensitivity.  Cultures are then obtained from the deep  fascial layer down to bone using both anaerobic and aerobic culturettes.  This was then debrided, lightly, using sharp dissection removing the  hydroxyapatite where found within the soft tissues.  Hydroxyapatite found at  the bone site was left in place.   Irrigation was then performed using a regular normal saline solution.  After  this was completed, then the fascial layer was closed using interrupted 2-0  Vicryl sutures over a 10-French drain exiting out distal to the incision  site.  Subcu layers were approximated with interrupted 2-0 Vicryl sutures  and the skin closed with vertical mattress sutures of 4-0 nylon.  A 10-  French TLS drain was sewn in place using a 4-0 nylon stitch.  ABDs, 4 x 4's  were affixed to the skin, with Kerlix and Ace wrap applied from the left  elbow to the left upper arm.  The TLS drain was charged and placed over the  outside of the dressing, taped into place.  The patient was then  reactivated, extubated, and returned to the recovery room in satisfactory  condition.  There was no tourniquet time.  All instrument and sponge counts  were correct.   POSTOPERATIVE CARE:  The patient will be discharged home.  He will change  his TLS drain as necessary if it fills with fluid.  He will be seen back to  the office on Friday, in 2 days, to undergo removal of the TLS drain.      Kerrin Champagne, M.D.  Electronically Signed    JEN/MEDQ  D:  06/13/2005  T:  06/13/2005  Job:   [id number]

## 2010-06-02 NOTE — Op Note (Signed)
NAME:  Alvin Morrison, Alvin Morrison              ACCOUNT NO.:  [id number]   MEDICAL RECORD NO.:  [id number]          PATIENT TYPE:  AMB   LOCATION:  DAY                          FACILITY:  [facility]   PHYSICIAN:  Kerrin Champagne, M.D.   DATE OF BIRTH:  Jan 19, 1955   DATE OF PROCEDURE:  05/30/2005  DATE OF DISCHARGE:                                 OPERATIVE REPORT   PREOPERATIVE DIAGNOSES:  Delayed union, left middle one-third humeral shaft  fracture, status post IM nail with Stryker nail -- with both proximal and  distal interlocking screws.   POSTOPERATIVE DIAGNOSES:  Delayed union, left middle one-third humeral shaft  fracture, status post IM nail with Stryker nail -- with both proximal and  distal interlocking screws.   PROCEDURES:  1.  Removal of proximal and distal interlocking screws, left; Stryker IM      humeral nail.  2.  Instilling 3 cc of hydroxyapatite to the fracture site.   SURGEON:  Kerrin Champagne, M.D.   ASSISTANT:  None.   ANESTHESIA:  General via oral esophageal obturator.   ANESTHESIOLOGIST:  Jenelle Mages. Fortune, M.D.   SPECIMENS:  None.   ESTIMATED BLOOD LOSS:  80 cc.   COMPLICATIONS:  None.   DISPOSITION:  The patient returned to the PACU in good condition.   HISTORY OF PRESENT ILLNESS:  A 55 year old right-hand dominant male who  worked for [employer].  The patient fell while on the the job in the Fall 2006.  The patient was taken to the operating room and underwent an  IM nail after  a period of 3-1/2 months of conservative management of the left humeral  shaft fracture, in combination with a left medial condyle elbow fracture.  The elbow fracture has since gone on to heal with good range of motion, but  the patient has had persistent motion at the humeral shaft fracture site  post IM nailing.  The date of the IM nail was July 21, 2004.  The date of the  fracture approximately [id number].   The patient is brought to the operating room today in order to dynamize the  IM  nail, with removal of both proximal interlocking screws; and  intraoperatively underwent placement of hydroxyapatite material in an  osteoconductive fashion.   DESCRIPTION OF PROCEDURE:  After adequate general anesthesia with the  patient on the shoulder table, the left upper extremity prepped after  placing a bump beneath the left scapula.  This was prepped from the left  wrist to the upper arm axillary area and circumferentially about the  shoulder.  Draped in the usual manner.  C-arm fluoroscope was sterilely  draped and brought into the field under direct visualization.  Localization  of the proximal interlocking screw through the old previous incision scar,  and this was infiltrated with Marcaine 0.5% with 1:200,000 epinephrine.  A  stab incision was made with the 15-blade scalpel, and the soft tissues were  then spread down to bone using a hemostat -- localizing the proximal  interlocking screw.  The proximal interlocking screw head was then engaged  with the screwdriver; however, it  turned and it was unable to be backed out.  Therefore, a hemostat was then used to carefully grasp the head of the hex  head of the screw, and then to extract it through the wound and turning it  slightly in order to remove.  This completed the removal of the proximal  interlocking screw; the distal interlocking screw was placed from anterior  to posterior.  It was localized using a C-arm and hemostat.  Stab incision  made through the old incision scar.  Hemostat used to spread through the  distal biceps down to the anterior cortex of the humerus, and the distal  interlocking screw head was carefully found.  Attempts to remove it were  unsuccessful using a screwdriver; the screw appeared to be removed and only  spun within its distal pole.  A Freer elevator was used to free soft tissue  about the head of the screw, and then the head of the screw was grasped  using a tonsil clamp; and then pulled free of  the old incision.  This was  done without difficulty.  The patient maintained excellent brachial pulse  throughout the procedure.  Had some minimal bleeding from both incision  sites.  These proximal and distal interlocking screw holes were then  irrigated and then closed with subcutaneous sutures of 2-0 Vicryl.  Tincture  of benzoin and Steri-Strips were applied.   The fracture site was then localized using a hemostat over the lateral  incision used previously, to perform open reduction and placement of IM  nail.  After localization within the old scar, a stab incision was made with  a 15-blade scalpel, and the hemostat then used to spread the subcutaneous  layers down to bone.   Here the fracture site was identified and carefully it was freed up using  tonsil clamp, as well as a subperiosteal elevator.  The hydroxyapatite  material by Stryker was mixed, and a 3 cc syringe of material was injected  through the open wound site into the fracture site.  We had difficulty  injecting it fully, as it appeared to harden within the cannula --  preventing it from being instilled correctly.  Further 5 cc  was then used,  and this also was able to be instilled partly, with much of it remaining  within the syringe and being unable to be instilled -- due to the hardening  within the cannula.  Further material was instilled through the wound using  hemostats as well as Therapist, nutritional.   This completed the placement of osteoconductive material at the fracture  site.  C-arm images were obtained, documenting the fracture site made  stronger.  The patient then had closure of the small incision laterally with  a single subcuticular suture of 2-0 Vicryl.  Tincture of benzoin Steri-  Strips applied.  Areas of skin carefully dried.  Then 4x4's were then  affixed to each of the incision sites, with Kerlix and then Coban used to hold in place and taped at the upper aspect to prevent from sliding  distally.   The patient was then reactivated, extubated and returned to the  recovery room in satisfactory condition.  All instrument and sponge counts  were correct.   POSTOPERATIVE CARE:  The patient will be discharged home today, to be seen  back in the office in follow-up in the next one week to assess wound.  Began  him on a series of isometric exercises to try and impact the fracture site  and to further healing.      Kerrin Champagne, M.D.  Electronically Signed     JEN/MEDQ  D:  05/30/2005  T:  05/31/2005  Job:  [id number]

## 2010-06-29 ENCOUNTER — Telehealth: Payer: Self-pay | Admitting: *Deleted

## 2010-06-29 NOTE — Telephone Encounter (Signed)
Pt has been having dizziness, low BP, and fluttering in chest all week.  Advised ER visit.

## 2010-07-03 ENCOUNTER — Ambulatory Visit (INDEPENDENT_AMBULATORY_CARE_PROVIDER_SITE_OTHER): Payer: BC Managed Care – PPO | Admitting: Family Medicine

## 2010-07-03 ENCOUNTER — Telehealth: Payer: Self-pay | Admitting: Cardiology

## 2010-07-03 ENCOUNTER — Encounter: Payer: Self-pay | Admitting: Family Medicine

## 2010-07-03 VITALS — BP 142/90 | HR 64 | Temp 98.1°F | Ht 71.0 in | Wt 192.0 lb

## 2010-07-03 DIAGNOSIS — I4891 Unspecified atrial fibrillation: Secondary | ICD-10-CM

## 2010-07-03 DIAGNOSIS — I499 Cardiac arrhythmia, unspecified: Secondary | ICD-10-CM

## 2010-07-03 HISTORY — DX: Unspecified atrial fibrillation: I48.91

## 2010-07-03 LAB — CBC WITH DIFFERENTIAL/PLATELET
Basophils Absolute: 0.1 10*3/uL (ref 0.0–0.1)
Basophils Relative: 0.7 % (ref 0.0–3.0)
Eosinophils Absolute: 0.4 10*3/uL (ref 0.0–0.7)
Lymphocytes Relative: 28.2 % (ref 12.0–46.0)
MCHC: 34.3 g/dL (ref 30.0–36.0)
MCV: 90.8 fl (ref 78.0–100.0)
Monocytes Absolute: 0.8 10*3/uL (ref 0.1–1.0)
Monocytes Relative: 9.2 % (ref 3.0–12.0)
Neutro Abs: 5.1 10*3/uL (ref 1.4–7.7)
Platelets: 190 10*3/uL (ref 150.0–400.0)
RDW: 13.2 % (ref 11.5–14.6)

## 2010-07-03 LAB — PROTIME-INR: Prothrombin Time: 11.4 s (ref 10.2–12.4)

## 2010-07-03 LAB — TSH: TSH: 1.65 u[IU]/mL (ref 0.35–5.50)

## 2010-07-03 MED ORDER — DILTIAZEM HCL ER COATED BEADS 120 MG PO CP24: 120.0000 mg | ORAL_CAPSULE | Freq: Every day | ORAL | Status: DC

## 2010-07-03 MED ORDER — WARFARIN SODIUM 5 MG PO TABS: ORAL_TABLET | ORAL | Status: DC

## 2010-07-03 NOTE — Progress Notes (Signed)
  Subjective:    Patient ID: Alvin Morrison, male    DOB: [birth year], 55 y.o.   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  He states that about a week or 10 days ago he noticed rapid heart rate.  It's been a regular skipping.  No chest pain.  He does sometimes feel short of breath.  No precipitating cause.  No previous history of irregular heart rate.  He takes it, Tenormin, 25 mg daily, and Hyzaar 50 -- 25 daily for hypertension.  BP today 140/90.  Cardiac review of systems otherwise negative.  His son has had a history of AF.   Review of Systems    Gentle and cardiac review of systems otherwise negative Objective:   Physical Exam    Well outlined.  There is no known acute distress.  Lungs are clear to auscultation.  Cardiac exam shows normal murmur rapid heart rate.  EKG confirms afib. After our conversation, which reviewed his symptoms and treatment options.  He said he was very upset because he called here last week and was referred to the emergency room.  He then called cardiology and they referred him to the emergency room he stated that he needs a doctor that he can talk to.  I tried to explain today as can be a serious problem.  However, he seemed to be very upset that he wasn't seen here as opposed to being referred to the emergency room     Assessment & Plan:  Atrial fibrillation,,,,,,,,,,, add Cardizem 120 mg daily, coumadinize, 2-D echo, and labs and cardiac consult ASAP

## 2010-07-03 NOTE — Progress Notes (Signed)
Addended by: Kern Reap B on: 07/03/2010 01:59 PM   Modules accepted: Orders

## 2010-07-03 NOTE — Patient Instructions (Signed)
Begin Coumadin, take two tabs tonight, Tuesday, and Wednesday, then one tablet starting Thursday.  Return on Thursday afternoon at 4 p.m. For follow-up.  Begin Cardizem, one tablet daily, and decrease the Cozaar to one half tablet daily.  We will also get to set up for a cardiac evaluation and a 2-D echo to evaluate the function of your heart

## 2010-07-03 NOTE — Telephone Encounter (Signed)
Terri from Dr Tawanna Cooler office# [phone] wants the pt to be seen sooner then July. Dr Shirlee Latch first available is in July. Please let me know if i can double book pt sooner.

## 2010-07-04 NOTE — Telephone Encounter (Signed)
Dr Shirlee Latch said you could schedule the patient with him at 12 noon 07/06/10.  Dr Shirlee Latch has a treadmill scheduled for 07/06/10 at 12 noon.[name] Kitchen He said Lorin Picket could do the treadmill. Lorin Picket said he would prefer  the treadmill come at 8:30am 07/06/10. The patient had an echo scheduled for 07/11/10. I rescheduled the echo to 07/06/10 at 2 pm. The patient does not know the echo has been rescheduled to 07/06/10 at 2 PM. Thanks. Thurston Hole

## 2010-07-06 ENCOUNTER — Ambulatory Visit: Payer: BC Managed Care – PPO | Admitting: Family Medicine

## 2010-07-06 ENCOUNTER — Ambulatory Visit (INDEPENDENT_AMBULATORY_CARE_PROVIDER_SITE_OTHER): Payer: BC Managed Care – PPO | Admitting: Cardiology

## 2010-07-06 ENCOUNTER — Ambulatory Visit (HOSPITAL_COMMUNITY): Payer: BC Managed Care – PPO | Attending: Family Medicine

## 2010-07-06 ENCOUNTER — Encounter: Payer: Self-pay | Admitting: Cardiology

## 2010-07-06 VITALS — BP 120/82 | HR 69 | Ht 71.0 in | Wt 188.4 lb

## 2010-07-06 DIAGNOSIS — I4891 Unspecified atrial fibrillation: Secondary | ICD-10-CM

## 2010-07-06 DIAGNOSIS — I059 Rheumatic mitral valve disease, unspecified: Secondary | ICD-10-CM | POA: Insufficient documentation

## 2010-07-06 DIAGNOSIS — I10 Essential (primary) hypertension: Secondary | ICD-10-CM | POA: Insufficient documentation

## 2010-07-06 DIAGNOSIS — I079 Rheumatic tricuspid valve disease, unspecified: Secondary | ICD-10-CM | POA: Insufficient documentation

## 2010-07-06 DIAGNOSIS — I499 Cardiac arrhythmia, unspecified: Secondary | ICD-10-CM

## 2010-07-06 MED ORDER — DRONEDARONE HCL 400 MG PO TABS: 400.0000 mg | ORAL_TABLET | Freq: Two times a day (BID) | ORAL | Status: DC

## 2010-07-06 NOTE — Telephone Encounter (Signed)
Dr. Tawanna Cooler informed,

## 2010-07-06 NOTE — Patient Instructions (Addendum)
Stop Coumadin(warfarin)  Stop Atenolol.  Increase Aspirin to 325mg  daily. This should be enteric coated.  Start Multaq 400mg  twice a day WITH FOOD.  Have your echocardiogram today.  Schedule an EKG for 2 weeks.  Schedule an appointment to see Dr Shirlee Latch in 1 month.

## 2010-07-07 NOTE — Assessment & Plan Note (Signed)
Paroxysmal.  He had a prolonged, symptomatic episode that appears to have resolved yesterday.  He is back in NSR today.  CHADSVASC score = 1 (HTN), so at this point I think it would be ok to maintain him on ASA 325 mg daily rather than on coumadin. He needs to avoid stimulant-containing decongestants.  He can use coricidin, Atrovent nasal spray.  He also should moderate his ETOH intake a bit more.  He does not screen positive for OSA.    - Stop coumadin and start ASA 325 mg daily - He seems to poorly tolerate atrial fibrillation.  I will try to maintain him in NSR using dronedarone 400 mg bid (should be taken with food).  ECG in 2 wks for QT interval with initiation of dronedarone.  .   - D/c atenolol, can continue diltiazem.   - Followup in this office in 1 month

## 2010-07-07 NOTE — Progress Notes (Signed)
PCP: Dr. Tawanna Cooler  55 yo with history of HTN presents for evaluation of new-onset atrial fibrillation.  Patient saw Dr. Tawanna Cooler on 6/18 and was noted to be in atrial fibrillation with rapid response.  He has been feeling fatigued and anxious for 3-4 days prior to this and felt his heart racing.  He had some lightheadedness and also noted that his systolic blood pressure was running in the 90s-100s, which is very low for him.  No dyspnea, no chest pain.  Prior to the onset of symptoms, he had been taking a decongestant from [employer] (he is not sure which one) for his chronic nasal congestion.  He feels like the symptoms resolved completely yesterday (no more tachypalpitations or lightheadedness and better energy level).  Today, he is back in NSR on ECG.    He has had no prior episodes similar to this. He is in generally good health.  BP is controlled today.  Interestingly, his son developed atrial fibrillation at age 63.   ECG: NSR, normal  Labs (8/11): LDL 82, HDL 38, K 4.1, creatinine 1.0 Labs (6/12): TSH normal, HCT 44.3  PMH:  1. Allergic rhinitis 2. HTN 3. Atrial fibrillation: first noted in 6/12.  4. Appendectomy 5. Sinus surgery  FH: mother with rheumatic heart disease, CVA.  Son developed atrial fibrillation at age 5 and has had DCCV x 2.   SH: Lives in [place] with wife.  Retired Futures trader.  Nonsmoker.  Drinks 1-2 glasses wine/day on weekdays, 3-5 drinks at day on weekends.    ROS: All systems reviewed and negative except as per HPI.   Current Outpatient Prescriptions  Medication Sig Dispense Refill  . calcium citrate-vitamin D (CITRACAL+D) 315-200 MG-UNIT per tablet Take 1 tablet by mouth daily.        . cetirizine (ZYRTEC) 10 MG tablet Take 10 mg by mouth daily.        [name] Kitchen diltiazem (CARDIZEM CD) 120 MG 24 hr capsule Take 1 capsule (120 mg total) by mouth daily.  100 capsule  3  . fluticasone (FLONASE) 50 MCG/ACT nasal spray Place 2 sprays into the nose daily.        [name] Kitchen  ibuprofen (ADVIL,MOTRIN) 200 MG tablet Take 200 mg by mouth every 6 (six) hours as needed.        . Iron-Vitamins (GERITOL PO) Take 1 tablet by mouth.        . losartan (COZAAR) 50 MG tablet Take 25 mg by mouth daily.        . niacin 500 MG CR capsule Take 500 mg by mouth at bedtime.        [name] Kitchen omeprazole (PRILOSEC OTC) 20 MG tablet Take 20 mg by mouth daily.        . Red Yeast Rice 600 MG CAPS Take by mouth.        . triamcinolone (KENALOG) 0.5 % cream Apply topically 3 (three) times daily.        [name] Kitchen DISCONTD: losartan (COZAAR) 50 MG tablet Take 1 tablet (50 mg total) by mouth daily.  100 tablet  3  . aspirin 325 MG EC tablet Take 1 tablet (325 mg total) by mouth daily.      [name] Kitchen dronedarone (MULTAQ) 400 MG tablet Take 1 tablet (400 mg total) by mouth 2 (two) times daily with a meal.  60 tablet  2    BP 120/82  Pulse 69  Ht 5\' 11"  (1.803 m)  Wt 188 lb 6.4 oz (85.458 kg)  BMI  26.28 kg/m2 General: NAD Neck: No JVD, no thyromegaly or thyroid nodule.  Lungs: Clear to auscultation bilaterally with normal respiratory effort. CV: Nondisplaced PMI.  Heart regular S1/S2, no S3/S4, no murmur.  No peripheral edema.  No carotid bruit.  Normal pedal pulses.  Abdomen: Soft, nontender, no hepatosplenomegaly, no distention.  Neurologic: Alert and oriented x 3.  Psych: Normal affect. Extremities: No clubbing or cyanosis.

## 2010-07-11 ENCOUNTER — Other Ambulatory Visit (HOSPITAL_COMMUNITY): Payer: BC Managed Care – PPO | Admitting: Radiology

## 2010-07-20 ENCOUNTER — Ambulatory Visit (INDEPENDENT_AMBULATORY_CARE_PROVIDER_SITE_OTHER): Payer: BC Managed Care – PPO

## 2010-07-20 VITALS — BP 144/78 | HR 59 | Ht 71.0 in | Wt 191.8 lb

## 2010-07-20 DIAGNOSIS — I4891 Unspecified atrial fibrillation: Secondary | ICD-10-CM

## 2010-07-20 DIAGNOSIS — I10 Essential (primary) hypertension: Secondary | ICD-10-CM

## 2010-07-20 NOTE — Progress Notes (Signed)
Pt in for EKG today, SR HR 59, he states he has been doing ok, he had noticed his BP was elevated off the Atenolol so he increased his Cozaar to 50 mg daily.  He states he has had some itching on inner thighs and arms but it never really broke out like a rash and it is better now.  EKG sent to Dr Shirlee Latch for review pt is sch for f/u in 2 weeks. Alvin Morrison 10:55 AM

## 2010-08-04 ENCOUNTER — Encounter: Payer: Self-pay | Admitting: Cardiology

## 2010-08-08 ENCOUNTER — Ambulatory Visit (INDEPENDENT_AMBULATORY_CARE_PROVIDER_SITE_OTHER): Payer: BC Managed Care – PPO | Admitting: Cardiology

## 2010-08-08 ENCOUNTER — Encounter: Payer: Self-pay | Admitting: Cardiology

## 2010-08-08 VITALS — BP 148/90 | HR 60 | Ht 71.0 in | Wt 187.0 lb

## 2010-08-08 DIAGNOSIS — I4891 Unspecified atrial fibrillation: Secondary | ICD-10-CM

## 2010-08-08 DIAGNOSIS — I429 Cardiomyopathy, unspecified: Secondary | ICD-10-CM

## 2010-08-08 DIAGNOSIS — I428 Other cardiomyopathies: Secondary | ICD-10-CM

## 2010-08-08 DIAGNOSIS — I10 Essential (primary) hypertension: Secondary | ICD-10-CM

## 2010-08-08 HISTORY — DX: Cardiomyopathy, unspecified: I42.9

## 2010-08-08 MED ORDER — LOSARTAN POTASSIUM 100 MG PO TABS: 100.0000 mg | ORAL_TABLET | Freq: Every day | ORAL | Status: DC

## 2010-08-08 MED ORDER — METOPROLOL SUCCINATE ER 50 MG PO TB24: 50.0000 mg | ORAL_TABLET | Freq: Every day | ORAL | Status: DC

## 2010-08-08 NOTE — Assessment & Plan Note (Signed)
BP is running high.  I will increase losartan to 100 mg daily.  BMET in 2 wks.

## 2010-08-08 NOTE — Assessment & Plan Note (Signed)
Paroxysmal.  He had a prolonged, symptomatic episode that resolved after several days.  He remains in NSR today.  CHADSVASC score = 1 (HTN), so at this point I think it would be ok to maintain him on ASA 325 mg daily rather than on coumadin. He needs to avoid stimulant-containing decongestants (may have triggered his episode).  He can use coricidin or Atrovent nasal spray.  He also should moderate his ETOH intake.  He does not screen positive for OSA.    - I will let him try stopping dronedarone.  If symptomatic atrial fibrillation recurs, I will restart it. - With decreased LV systolic function, I will have him stop diltiazem CD and instead start Toprol XL 50 mg daily.

## 2010-08-08 NOTE — Patient Instructions (Addendum)
Stop Multaq.  Stop Diltiazem.  Increase Losartan to 100mg  daily.  Start Toprol XL 50mg  daily.  Schedule an appointment for a stress myoview. See instruction sheet.  Schedule an appointment for lab in 2 weeks--BMP [phone]  Schedule an appointment with Dr Shirlee Latch for 3 months.

## 2010-08-08 NOTE — Assessment & Plan Note (Signed)
EF 45-50% on echo with mild diffuse hypokinesis worse in the septum.  Patient has no symptoms suggestive of ischemia.  This could be a mild nonischemic cardiomyopathy, but I think that coronary disease as the etiology needs to be ruled out.  I will have him get an ETT-myoview.  I am going to maintain him on Toprol XL 50 mg daily and will increase losartan to 100 mg daily.

## 2010-08-08 NOTE — Progress Notes (Signed)
PCP: Dr. Tawanna Cooler  55 yo with history of HTN is seen in return visit for evaluation of atrial fibrillation.  Patient saw Dr. Tawanna Cooler on 6/18 and was noted to be in atrial fibrillation with rapid response.  He had been feeling fatigued and anxious for 3-4 days prior to this and felt his heart racing.  He had some lightheadedness and also noted that his systolic blood pressure was running in the 90s-100s, which is very low for him.  No dyspnea, no chest pain.  Prior to the onset of symptoms, he had been taking a decongestant from [employer] (he is not sure which one) for his chronic nasal congestion.  He was back in sinus rhythm by the time he saw me in the office.  Given the significant symptoms, I started him on dronedarone to try to prevent recurrent atrial fibrillation.  CHADSVASC score was 1 so I started him on ASA 325 mg daily.      Since last appointment, he has done well symptomatically.  No symptoms of recurrent atrial fibrillation.  He has excellent exercise tolerance with no exertional chest pain or dyspnea.  He exercises daily, walking up to 5 miles at a time with his wife with no problems.  He has noted his systolic BP to be running higher, in the 140s-150s range.   Echo was done recently and showed mildly depressed systolic function, EF 45-50% with diffuse hypokinesis worse in the septum.    ECG: Sinus brady at 52, QTc 385 msec  Labs (8/11): LDL 82, HDL 38, K 4.1, creatinine 1.0 Labs (6/12): TSH normal, HCT 44.3  PMH:  1. Allergic rhinitis 2. HTN 3. Atrial fibrillation: first noted in 6/12.  4. Appendectomy 5. Sinus surgery 6. Cardiomyopathy: Echo (6/12) with EF 45-50%, mild diffuse hypokinesis worse in the septum, mild MR, mild to moderate LAE.   FH: mother with rheumatic heart disease, CVA.  Son developed atrial fibrillation at age 27 and has had DCCV x 2.   SH: Lives in [place] with wife.  Retired Futures trader.  Nonsmoker.  Drinks 1-2 glasses wine/day on weekdays, 3-5 drinks at  day on weekends.    ROS: All systems reviewed and negative except as per HPI.   Current Outpatient Prescriptions  Medication Sig Dispense Refill  . aspirin 325 MG EC tablet Take 1 tablet (325 mg total) by mouth daily.      . calcium citrate-vitamin D (CITRACAL+D) 315-200 MG-UNIT per tablet Take 1 tablet by mouth daily.        . cetirizine (ZYRTEC) 10 MG tablet Take 10 mg by mouth daily.        . fluticasone (FLONASE) 50 MCG/ACT nasal spray Place 2 sprays into the nose daily.        [name] Kitchen ibuprofen (ADVIL,MOTRIN) 200 MG tablet Take 200 mg by mouth every 6 (six) hours as needed.        . Iron-Vitamins (GERITOL PO) Take 1 tablet by mouth.        . niacin 500 MG CR capsule Take 500 mg by mouth at bedtime.        [name] Kitchen omeprazole (PRILOSEC OTC) 20 MG tablet Take 20 mg by mouth daily.        . Red Yeast Rice 600 MG CAPS Take by mouth.        . DISCONTD: losartan (COZAAR) 25 MG tablet Take 25 mg by mouth daily.        [name] Kitchen losartan (COZAAR) 100 MG tablet Take 1 tablet (100  mg total) by mouth daily.  30 tablet  6  . metoprolol (TOPROL XL) 50 MG 24 hr tablet Take 1 tablet (50 mg total) by mouth daily.  30 tablet  6  . DISCONTD: losartan (COZAAR) 50 MG tablet Take 1 tablet (50 mg total) by mouth daily.  100 tablet  3  . DISCONTD: losartan (COZAAR) 50 MG tablet Take 50 mg by mouth daily.         BP 148/90  Pulse 60  Ht 5\' 11"  (1.803 m)  Wt 187 lb (84.823 kg)  BMI 26.08 kg/m2 General: NAD Neck: No JVD, no thyromegaly or thyroid nodule.  Lungs: Clear to auscultation bilaterally with normal respiratory effort. CV: Nondisplaced PMI.  Heart regular S1/S2, no S3/S4, no murmur.  No peripheral edema.  No carotid bruit.  Normal pedal pulses.  Abdomen: Soft, nontender, no hepatosplenomegaly, no distention.  Neurologic: Alert and oriented x 3.  Psych: Normal affect. Extremities: No clubbing or cyanosis.

## 2010-08-16 ENCOUNTER — Encounter: Payer: Self-pay | Admitting: *Deleted

## 2010-08-22 ENCOUNTER — Other Ambulatory Visit (INDEPENDENT_AMBULATORY_CARE_PROVIDER_SITE_OTHER): Payer: BC Managed Care – PPO | Admitting: *Deleted

## 2010-08-22 ENCOUNTER — Ambulatory Visit (HOSPITAL_COMMUNITY): Payer: BC Managed Care – PPO | Attending: Cardiology | Admitting: Radiology

## 2010-08-22 VITALS — Ht 70.0 in | Wt 187.0 lb

## 2010-08-22 DIAGNOSIS — I4891 Unspecified atrial fibrillation: Secondary | ICD-10-CM

## 2010-08-22 DIAGNOSIS — R0789 Other chest pain: Secondary | ICD-10-CM

## 2010-08-22 DIAGNOSIS — R0989 Other specified symptoms and signs involving the circulatory and respiratory systems: Secondary | ICD-10-CM

## 2010-08-22 LAB — BASIC METABOLIC PANEL
GFR: 82.96 mL/min (ref >60.00)
Glucose, Bld: 102 mg/dL — ABNORMAL HIGH (ref 70–99)
Potassium: 4.4 mEq/L (ref 3.5–5.1)
Sodium: 139 mEq/L (ref 135–145)

## 2010-08-22 MED ORDER — TECHNETIUM TC 99M TETROFOSMIN IV KIT
33.0000 | PACK | Freq: Once | INTRAVENOUS | Status: AC | PRN
  Administered 2010-08-22: 33 via INTRAVENOUS

## 2010-08-22 MED ORDER — TECHNETIUM TC 99M TETROFOSMIN IV KIT
11.0000 | PACK | Freq: Once | INTRAVENOUS | Status: AC | PRN
  Administered 2010-08-22: 11 via INTRAVENOUS

## 2010-08-22 NOTE — Progress Notes (Signed)
[facility] SITE 3 NUCLEAR MED [address] [place] [address] [phone]  Cardiology Nuclear Med Study  Alvin Morrison is a 55 y.o. male [id number] Nov 08, 1955   Nuclear Med Background Indication for Stress Test:  Evaluation for Ischemia History:  6/12 Echo: EF=45-50%; No prior known history of CAD Cardiac Risk Factors: Hypertension  Symptoms:  Chest Pain last episode 06/2010, DOE. Dizziness Light-Headedness, Palpitations, Rapid HR and SOB associated with PAF 06/2010.   Nuclear Pre-Procedure Caffeine/Decaff Intake:  None NPO After: 8:30pm   Lungs:  Clear IV 0.9% NS with Angio Cath:  20g  IV Site: R Hand  IV Started by:  Cathlyn Parsons, RN  Chest Size (in):  42 Cup Size: n/a  Height: 5\' 10"  (1.778 m)  Weight:  187 lb (84.823 kg)  BMI:  Body mass index is 26.83 kg/(m^2). Tech Comments:  Metoprolol held x 24 hrs    Nuclear Med Study 1 or 2 day study: 1 day  Stress Test Type:  Stress  Reading MD: Olga Millers, MD  Order Authorizing Provider:  Marca Ancona, MD  Resting Radionuclide: Technetium 44m Tetrofosmin  Resting Radionuclide Dose: 11 mCi   Stress Radionuclide:  Technetium 58m Tetrofosmin  Stress Radionuclide Dose: 33 mCi           Stress Protocol Rest HR: 43 Stress HR: 144  Rest BP: 144/99 Stress BP: 189/96  Exercise Time (min): 14:15 METS: 17.20   Predicted Max HR: 164 bpm % Max HR: 87.8 bpm Rate Pressure Product: 21308   Dose of Adenosine (mg):  n/a Dose of Lexiscan: n/a mg  Dose of Atropine (mg): n/a Dose of Dobutamine: n/a mcg/kg/min (at max HR)  Stress Test Technologist: Irean Hong, RN  Nuclear Technologist:  Domenic Polite, CNMT     Rest Procedure:  Myocardial perfusion imaging was performed at rest 45 minutes following the intravenous administration of Technetium 85m Tetrofosmin. Rest ECG: Marked SB  Stress Procedure:  The patient exercised for 14 minutes and 15 seconds, RPE=15.  The patient stopped due to DOE and  denied any chest pain.  There were no significant ST-T wave changes. There was a mild hypertensive response.  Technetium 75m Tetrofosmin was injected at peak exercise and myocardial perfusion imaging was performed after a brief delay. Stress ECG: No significant ST segment change suggestive of ischemia.  QPS Raw Data Images:  Acquisition technically good; mild LVE. Stress Images:  There is decreased uptake in the apex. Rest Images:  Normal homogeneous uptake in all areas of the myocardium. Subtraction (SDS):  Very small reversible defect at the apex felt most likely not to be clinically significant. Transient Ischemic Dilatation (Normal <1.22):  .91 Lung/Heart Ratio (Normal <0.45):  .19  Quantitative Gated Spect Images QGS EDV:  154 ml QGS ESV:  67 ml QGS cine images:  NL LV Function; NL Wall Motion QGS EF: 56%  Impression Exercise Capacity:  Excellent exercise capacity. BP Response:  Normal blood pressure response. Clinical Symptoms:  No chest pain. ECG Impression:  No significant ST segment change suggestive of ischemia. Comparison with Prior Nuclear Study: No previous nuclear study performed  Overall Impression:  Normal stress nuclear study.   Olga Millers

## 2010-08-23 NOTE — Progress Notes (Addendum)
nuc med report routed to Dr. Shirlee Latch 08/23/10 Alvin Morrison  Excellent exercise tolerance, EF normal, no evidence for ischemia. Please let patient know.  Dalton [employer]

## 2010-08-25 ENCOUNTER — Telehealth: Payer: Self-pay | Admitting: *Deleted

## 2010-08-25 NOTE — Progress Notes (Signed)
Pt aware of test results. Debbie Rashia Mckesson RN  

## 2010-08-25 NOTE — Telephone Encounter (Signed)
Pt aware of stress test results and will keep follow-up appt in 3 months. Mylo Red RN

## 2010-09-05 ENCOUNTER — Encounter: Payer: Self-pay | Admitting: Cardiology

## 2010-09-25 ENCOUNTER — Other Ambulatory Visit (INDEPENDENT_AMBULATORY_CARE_PROVIDER_SITE_OTHER): Payer: BC Managed Care – PPO

## 2010-09-25 DIAGNOSIS — Z0000 Encounter for general adult medical examination without abnormal findings: Secondary | ICD-10-CM

## 2010-09-25 LAB — CBC WITH DIFFERENTIAL/PLATELET
Eosinophils Relative: 5.4 % — ABNORMAL HIGH (ref 0.0–5.0)
HCT: 43.9 % (ref 39.0–52.0)
Hemoglobin: 14.6 g/dL (ref 13.0–17.0)
Lymphs Abs: 1.7 10*3/uL (ref 0.7–4.0)
MCV: 90 fl (ref 78.0–100.0)
Monocytes Absolute: 0.5 10*3/uL (ref 0.1–1.0)
Neutro Abs: 3.9 10*3/uL (ref 1.4–7.7)
Platelets: 141 10*3/uL — ABNORMAL LOW (ref 150.0–400.0)
WBC: 6.5 10*3/uL (ref 4.5–10.5)

## 2010-09-25 LAB — BASIC METABOLIC PANEL
CO2: 27 mEq/L (ref 19–32)
Chloride: 104 mEq/L (ref 96–112)
Sodium: 138 mEq/L (ref 135–145)

## 2010-09-25 LAB — LIPID PANEL
Cholesterol: 165 mg/dL (ref 0–200)
LDL Cholesterol: 102 mg/dL — ABNORMAL HIGH (ref 0–99)
Total CHOL/HDL Ratio: 4
VLDL: 24.8 mg/dL (ref 0.0–40.0)

## 2010-09-25 LAB — POCT URINALYSIS DIPSTICK
Bilirubin, UA: NEGATIVE
Glucose, UA: NEGATIVE
Ketones, UA: NEGATIVE
Leukocytes, UA: NEGATIVE
Spec Grav, UA: 1.025

## 2010-09-25 LAB — HEPATIC FUNCTION PANEL
Alkaline Phosphatase: 46 U/L (ref 39–117)
Bilirubin, Direct: 0.1 mg/dL (ref 0.0–0.3)
Total Bilirubin: 0.8 mg/dL (ref 0.3–1.2)
Total Protein: 6.2 g/dL (ref 6.0–8.3)

## 2010-09-25 LAB — PSA: PSA: 2.24 ng/mL (ref 0.10–4.00)

## 2010-10-02 ENCOUNTER — Encounter: Payer: Self-pay | Admitting: Family Medicine

## 2010-10-02 ENCOUNTER — Ambulatory Visit (INDEPENDENT_AMBULATORY_CARE_PROVIDER_SITE_OTHER): Payer: BC Managed Care – PPO | Admitting: Family Medicine

## 2010-10-02 VITALS — BP 164/90 | Temp 97.7°F | Ht 70.5 in | Wt 190.0 lb

## 2010-10-02 DIAGNOSIS — I10 Essential (primary) hypertension: Secondary | ICD-10-CM

## 2010-10-02 DIAGNOSIS — J309 Allergic rhinitis, unspecified: Secondary | ICD-10-CM

## 2010-10-02 DIAGNOSIS — J45909 Unspecified asthma, uncomplicated: Secondary | ICD-10-CM

## 2010-10-02 DIAGNOSIS — I4891 Unspecified atrial fibrillation: Secondary | ICD-10-CM

## 2010-10-02 DIAGNOSIS — I428 Other cardiomyopathies: Secondary | ICD-10-CM

## 2010-10-02 DIAGNOSIS — Z23 Encounter for immunization: Secondary | ICD-10-CM

## 2010-10-02 DIAGNOSIS — Z0000 Encounter for general adult medical examination without abnormal findings: Secondary | ICD-10-CM

## 2010-10-02 MED ORDER — METOPROLOL SUCCINATE ER 50 MG PO TB24: 50.0000 mg | ORAL_TABLET | Freq: Every day | ORAL | Status: DC

## 2010-10-02 MED ORDER — FLUTICASONE PROPIONATE 50 MCG/ACT NA SUSP: 2.0000 | Freq: Every day | NASAL | Status: DC

## 2010-10-02 MED ORDER — MONTELUKAST SODIUM 10 MG PO TABS: 10.0000 mg | ORAL_TABLET | Freq: Every day | ORAL | Status: DC

## 2010-10-02 MED ORDER — LOSARTAN POTASSIUM 100 MG PO TABS: 100.0000 mg | ORAL_TABLET | Freq: Every day | ORAL | Status: DC

## 2010-10-02 NOTE — Progress Notes (Signed)
  Subjective:    Patient ID: Alvin Morrison, male    DOB: 08-21-1955, 55 y.o.   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  However, this fall.  He is having a lot of symptoms.  Discussed options.  Will give him a trial of Singulair.  He takes Cozaar 100 mg daily and Toprol 50 mg daily for hypertension and cardiomyopathy.  BP at home, averaging 135/85 or less.  He also takes an aspirin tablet, calcium tablets, Motrin, p.r.n., iron, and vitamins garlic zinc, vitamin C, and krill and red yeast rice   Review of Systems  Genitourinary: Positive for scrotal swelling.       Objective:   Physical Exam  Constitutional: He is oriented to person, place, and time. He appears well-developed and well-nourished.  HENT:  Head: Normocephalic and atraumatic.  Right Ear: External ear normal.  Left Ear: External ear normal.  Nose: Nose normal.  Mouth/Throat: Oropharynx is clear and moist.  Eyes: Conjunctivae and EOM are normal. Pupils are equal, round, and reactive to light.  Neck: Normal range of motion. Neck supple. No JVD present. No tracheal deviation present. No thyromegaly present.  Cardiovascular: Normal rate, regular rhythm, normal heart sounds and intact distal pulses.  Exam reveals no gallop and no friction rub.   No murmur heard. Pulmonary/Chest: Effort normal and breath sounds normal. No stridor. No respiratory distress. He has no wheezes. He has no rales. He exhibits no tenderness.  Abdominal: Soft. Bowel sounds are normal. He exhibits no distension and no mass. There is no tenderness. There is no rebound and no guarding.  Genitourinary: Rectum normal, prostate normal and penis normal. Guaiac negative stool. No penile tenderness.       A small, pea-sized cystic lesion upper portion of the right, scrotum  it's soft rubbery movable, and according to patient has not increased in size  Musculoskeletal: Normal range of motion. He exhibits no edema and no tenderness.  Lymphadenopathy:    He has no cervical adenopathy.  Neurological: He is alert and oriented to person, place, and time. He has normal reflexes. No cranial nerve deficit. He exhibits normal muscle tone.  Skin: Skin is warm and dry. No rash noted. No erythema. No pallor.  Psychiatric: He has a normal mood and affect. His behavior is normal. Judgment and thought content normal.          Assessment & Plan:  A healthy male.  History of hypertension, and cardiomyopathy.  Continue above medications.  Monitor BP at home.  Allergic rhinitis.  Continue Zyrtec and Flonase add Singulair.  I  Admonished him to only take Motrin in very small amounts and only infrequently because of its potential for increasing his blood pressure.  Return in one year, sooner if any problem

## 2010-10-02 NOTE — Patient Instructions (Signed)
Continue your current medications  Add Singulair 10 mg daily year treatment for the allergic rhinitis.  If we see that the combination of Zyrtec, Flonase, and Singulair, does not controlling y  symptoms, then I would recommend a consult from Dr. Reather Converse, allergist immunologist Return in one year, sooner if any problems

## 2010-10-31 ENCOUNTER — Encounter: Payer: Self-pay | Admitting: Cardiology

## 2010-10-31 ENCOUNTER — Ambulatory Visit (INDEPENDENT_AMBULATORY_CARE_PROVIDER_SITE_OTHER): Payer: BC Managed Care – PPO | Admitting: Cardiology

## 2010-10-31 DIAGNOSIS — I10 Essential (primary) hypertension: Secondary | ICD-10-CM

## 2010-10-31 DIAGNOSIS — I428 Other cardiomyopathies: Secondary | ICD-10-CM

## 2010-10-31 DIAGNOSIS — I4891 Unspecified atrial fibrillation: Secondary | ICD-10-CM

## 2010-10-31 LAB — BASIC METABOLIC PANEL
CO2: 27 mEq/L (ref 19–32)
Calcium: 8.7 mg/dL (ref 8.4–10.5)
Chloride: 106 mEq/L (ref 96–112)
Glucose, Bld: 105 mg/dL — ABNORMAL HIGH (ref 70–99)
Sodium: 139 mEq/L (ref 135–145)

## 2010-10-31 NOTE — Assessment & Plan Note (Signed)
BP excellent with increase in losartan.  However, K was running a little high in 9/12.  I will repeat BMET today.

## 2010-10-31 NOTE — Patient Instructions (Signed)
Your physician recommends that you have lab work today-=BMP 427.31  425.4  Your physician has requested that you have an echocardiogram. Echocardiography is a painless test that uses sound waves to create images of your heart. It provides your doctor with information about the size and shape of your heart and how well your heart's chambers and valves are working. This procedure takes approximately one hour. There are no restrictions for this procedure. July 2013  Your physician wants you to follow-up in: July 2013 . You will receive a reminder letter in the mail two months in advance. If you don't receive a letter, please call our office to schedule the follow-up appointment.  Have the echo done a few days before the appt with Dr Shirlee Latch or the day that you see Dr Shirlee Latch.

## 2010-10-31 NOTE — Progress Notes (Signed)
PCP: Dr. Tawanna Cooler  55 yo with history of HTN is seen in return visit for evaluation of atrial fibrillation.  Patient saw Dr. Tawanna Cooler on 6/18 and was noted to be in atrial fibrillation with rapid response.  He had been feeling fatigued and anxious for 3-4 days prior to this and felt his heart racing.  He had some lightheadedness and also noted that his systolic blood pressure was running in the 90s-100s, which is very low for him.  No dyspnea, no chest pain.  Prior to the onset of symptoms, he had been taking a decongestant from [employer] (he is not sure which one) for his chronic nasal congestion.  He was back in sinus rhythm by the time he saw me in the office. CHADSVASC score was 1 so I started him on ASA 325 mg daily.      Since last appointment, he has done well symptomatically.  No symptoms of recurrent atrial fibrillation.  He is in sinus rhythm today.  He has excellent exercise tolerance with no exertional chest pain or dyspnea.  He exercises daily, walking up to 5 miles at a time with his wife with no problems.  Systolic BP is better with increased losartan, running in the 120s-130s.    Echo was done recently and showed mildly depressed systolic function, EF 45-50% with diffuse hypokinesis worse in the septum.  Given decreased LV systolic function with some regionality, I had him do an ETT-myoview.  This showed excellent exercise tolerance and no evidence for ischemia or infarction.  EF was 56% on the myoview.   Labs (8/11): LDL 82, HDL 38, K 4.1, creatinine 1.0 Labs (6/12): TSH normal, HCT 44.3 Labs (9/12): K 5.4, creatinine 1.1, LDL 102, HDL 38.3, TSH normal  PMH:  1. Allergic rhinitis 2. HTN 3. Atrial fibrillation: first noted in 6/12.  4. Appendectomy 5. Sinus surgery 6. Cardiomyopathy: Echo (6/12) with EF 45-50%, mild diffuse hypokinesis worse in the septum, mild MR, mild to moderate LAE.  ETT-myoview (7/12): 14'15" exercise, stopped due to dyspnea, no ECG changes, no evidence for ischemia or  infarction, EF 56%.   FH: mother with rheumatic heart disease, CVA.  Son developed atrial fibrillation at age 83 and has had DCCV x 2.   SH: Lives in [place] with wife.  Retired Futures trader.  Nonsmoker.  Drinks 1-2 glasses wine/day on weekdays, 3-5 drinks at day on weekends.    ROS: All systems reviewed and negative except as per HPI.   Current Outpatient Prescriptions  Medication Sig Dispense Refill  . aspirin 325 MG EC tablet Take 1 tablet (325 mg total) by mouth daily.      . calcium citrate-vitamin D (CITRACAL+D) 315-200 MG-UNIT per tablet Take 1 tablet by mouth daily.        . cetirizine (ZYRTEC) 10 MG tablet Take 10 mg by mouth daily.        . fluticasone (FLONASE) 50 MCG/ACT nasal spray Place 2 sprays into the nose daily.  16 g  11  . Garlic 1000 MG CAPS Take by mouth.        [name] Kitchen ibuprofen (ADVIL,MOTRIN) 200 MG tablet Take 200 mg by mouth every 6 (six) hours as needed.        . Iron-Vitamins (GERITOL PO) Take 1 tablet by mouth.        [name] Kitchen KRILL OIL 1000 MG CAPS Take by mouth.        . losartan (COZAAR) 100 MG tablet Take 1 tablet (100 mg total) by  mouth daily.  100 tablet  3  . metoprolol (TOPROL XL) 50 MG 24 hr tablet Take 1 tablet (50 mg total) by mouth daily.  100 tablet  3  . montelukast (SINGULAIR) 10 MG tablet Take 1 tablet (10 mg total) by mouth daily.  100 tablet  3  . niacin 500 MG CR capsule Take 500 mg by mouth at bedtime.        [name] Kitchen omeprazole (PRILOSEC OTC) 20 MG tablet Take 20 mg by mouth daily.        . Red Yeast Rice 600 MG CAPS Take by mouth.        . vitamin C (ASCORBIC ACID) 500 MG tablet Take 500 mg by mouth daily.        [name] Kitchen zinc gluconate 50 MG tablet Take 50 mg by mouth daily.        . Calcium-Magnesium-Zinc 1000-500-50 MG TABS Take by mouth.        . DISCONTD: losartan (COZAAR) 50 MG tablet Take 1 tablet (50 mg total) by mouth daily.  100 tablet  3    BP 134/72  Pulse 56  Ht 5\' 11"  (1.803 m)  Wt 194 lb 12 oz (88.338 kg)  BMI 27.16 kg/m2 General:  NAD Neck: No JVD, no thyromegaly or thyroid nodule.  Lungs: Clear to auscultation bilaterally with normal respiratory effort. CV: Nondisplaced PMI.  Heart regular S1/S2, no S3/S4, no murmur.  No peripheral edema.  No carotid bruit.  Normal pedal pulses.  Abdomen: Soft, nontender, no hepatosplenomegaly, no distention.  Neurologic: Alert and oriented x 3.  Psych: Normal affect. Extremities: No clubbing or cyanosis.

## 2010-10-31 NOTE — Assessment & Plan Note (Signed)
Paroxysmal.  He had a prolonged, symptomatic episode that resolved after several days.  He remains in NSR today as well as at prior appointments.  CHADSVASC score = 1 (HTN), so he is on ASA 325 mg daily rather than on coumadin. He needs to avoid stimulant-containing decongestants (may have triggered his episode).  He can use coricidin or Atrovent nasal spray.  He should continue to moderate his ETOH intake.  He does not screen positive for OSA.   Continue Toprol XL.

## 2010-10-31 NOTE — Assessment & Plan Note (Signed)
EF 45-50% on echo with septal hypokinesis.  Normal myoview with excellent exercise tolerance and no evidence for ischemia or infarction.  EF was 56% on myoview.  EF on echo may have been decreased in setting of recent episode of prolonged atrial fibrillation with rapid response.  I will repeat an echo 1 year after the first echo to make sure that systolic function has improved (and not worsened).  Continue current doses of losartan and Toprol XL.

## 2011-02-19 ENCOUNTER — Other Ambulatory Visit: Payer: Self-pay | Admitting: Family Medicine

## 2011-04-05 ENCOUNTER — Other Ambulatory Visit: Payer: Self-pay | Admitting: Family Medicine

## 2011-06-22 ENCOUNTER — Telehealth: Payer: Self-pay | Admitting: Cardiology

## 2011-06-22 NOTE — Telephone Encounter (Signed)
New msg Please call about a test he needs to have done. He thinks it is an ultrasound. I only see an order for echo. Please call

## 2011-06-22 NOTE — Telephone Encounter (Signed)
This patient should have an Echocardiogram (ultrasound of the heart) and an OV with Dr Shirlee Latch per recall.  I will send this message back to Melissa Hethcox to call the pt and schedule appointments.

## 2011-07-04 ENCOUNTER — Ambulatory Visit (HOSPITAL_COMMUNITY): Payer: BC Managed Care – PPO | Attending: Cardiology | Admitting: Radiology

## 2011-07-04 DIAGNOSIS — I428 Other cardiomyopathies: Secondary | ICD-10-CM | POA: Insufficient documentation

## 2011-07-04 DIAGNOSIS — I359 Nonrheumatic aortic valve disorder, unspecified: Secondary | ICD-10-CM | POA: Insufficient documentation

## 2011-07-04 DIAGNOSIS — I079 Rheumatic tricuspid valve disease, unspecified: Secondary | ICD-10-CM | POA: Insufficient documentation

## 2011-07-04 DIAGNOSIS — I10 Essential (primary) hypertension: Secondary | ICD-10-CM | POA: Insufficient documentation

## 2011-07-04 DIAGNOSIS — I4891 Unspecified atrial fibrillation: Secondary | ICD-10-CM

## 2011-07-04 NOTE — Progress Notes (Signed)
Echocardiogram performed.  

## 2011-07-20 ENCOUNTER — Encounter: Payer: Self-pay | Admitting: Cardiology

## 2011-07-20 ENCOUNTER — Ambulatory Visit (INDEPENDENT_AMBULATORY_CARE_PROVIDER_SITE_OTHER): Payer: BC Managed Care – PPO | Admitting: Cardiology

## 2011-07-20 VITALS — BP 144/82 | HR 46 | Ht 71.0 in | Wt 195.0 lb

## 2011-07-20 DIAGNOSIS — I428 Other cardiomyopathies: Secondary | ICD-10-CM

## 2011-07-20 DIAGNOSIS — I4891 Unspecified atrial fibrillation: Secondary | ICD-10-CM

## 2011-07-20 DIAGNOSIS — I429 Cardiomyopathy, unspecified: Secondary | ICD-10-CM

## 2011-07-20 DIAGNOSIS — I10 Essential (primary) hypertension: Secondary | ICD-10-CM

## 2011-07-22 NOTE — Assessment & Plan Note (Signed)
Mr Nannini checks his BP frequently at home and it is always within normal range.  It is mildly elevated here today.  Continue current meds.

## 2011-07-22 NOTE — Assessment & Plan Note (Signed)
EF 45-50% on echo in 2012 with septal hypokinesis.  Normal myoview with excellent exercise tolerance and no evidence for ischemia or infarction.  EF was 56% on myoview.  EF on that particular echo may have been decreased in setting of recent episode of prolonged atrial fibrillation with rapid response.  Repeat echo in 6/13 showed EF 50-55%, so no worsening.

## 2011-07-22 NOTE — Progress Notes (Signed)
Patient ID: Alvin Morrison, male   DOB: Feb 08, 1955, 56 y.o.   MRN: [id number] PCP: Dr. Tawanna Cooler  56 yo with history of HTN is seen in return visit for evaluation of atrial fibrillation.  Patient saw Dr. Tawanna Cooler in 6/12 and was noted to be in atrial fibrillation with rapid response.  He had been feeling fatigued and anxious for 3-4 days prior to this and felt his heart racing.  He had some lightheadedness and also noted that his systolic blood pressure was running in the 90s-100s, which is very low for him.  No dyspnea, no chest pain.  Prior to the onset of symptoms, he had been taking a decongestant from [employer] (he is not sure which one) for his chronic nasal congestion.  He was back in sinus rhythm by the time he saw me in the office. CHADSVASC score was 1 so I started him on ASA 325 mg daily.      Since last appointment with me, he has done well symptomatically.  No symptoms of recurrent atrial fibrillation.  He is in sinus rhythm today.  He has excellent exercise tolerance with no exertional chest pain or dyspnea.  He exercises daily, walking up to 5 miles at a time with his wife with no problems.  Systolic BP is high today but runs in the 120s when he checks it at home.  He was noted by echo to have mildly decreased LV systolic function.  Myoview showed no evidence for ischemia or infarction.  Recent repeat echo in 6/13 showed EF 50-55%.  HR runs 40s-50s at rest but rises to the 120s with exercise.  No lightheadedness or syncope.   ECG: NSR, rate 46  Labs (8/11): LDL 82, HDL 38, K 4.1, creatinine 1.0 Labs (6/12): TSH normal, HCT 44.3 Labs (9/12): K 5.4, creatinine 1.1, LDL 102, HDL 38.3, TSH normal Labs (10/12): K 4.5, creatinine 1.1  PMH:  1. Allergic rhinitis 2. HTN 3. Atrial fibrillation: first noted in 6/12.  4. Appendectomy 5. Sinus surgery 6. Cardiomyopathy: Echo (6/12) with EF 45-50%, mild diffuse hypokinesis worse in the septum, mild MR, mild to moderate LAE.  ETT-myoview (7/12): 14'15"  exercise, stopped due to dyspnea, no ECG changes, no evidence for ischemia or infarction, EF 56%.  Echo (6/13): EF 50-55%, no regional wall motion abnormalities.   FH: mother with rheumatic heart disease, CVA.  Son developed atrial fibrillation at age 19 and has had DCCV x 2.   SH: Lives in [place] with wife.  Retired Futures trader.  Nonsmoker.  Drinks 1-2 glasses wine/day on weekdays, 3-5 drinks at day on weekends.    ROS: All systems reviewed and negative except as per HPI.   Current Outpatient Prescriptions  Medication Sig Dispense Refill  . aspirin 325 MG EC tablet Take 1 tablet (325 mg total) by mouth daily.      . calcium citrate-vitamin D (CITRACAL+D) 315-200 MG-UNIT per tablet Take 1 tablet by mouth daily.        . Calcium-Magnesium-Zinc 1000-500-50 MG TABS Take by mouth.        . cetirizine (ZYRTEC) 10 MG tablet Take 10 mg by mouth daily.        . fluticasone (FLONASE) 50 MCG/ACT nasal spray USE AS DIRECTED AS NEEDED  16 g  2  . ibuprofen (ADVIL,MOTRIN) 200 MG tablet Take 200 mg by mouth every 6 (six) hours as needed.        . Iron-Vitamins (GERITOL PO) Take 1 tablet by mouth.        [name] Kitchen  losartan (COZAAR) 100 MG tablet Take 1 tablet (100 mg total) by mouth daily.  100 tablet  3  . metoprolol (TOPROL XL) 50 MG 24 hr tablet Take 1 tablet (50 mg total) by mouth daily.  100 tablet  3  . montelukast (SINGULAIR) 10 MG tablet Take 1 tablet (10 mg total) by mouth daily.  100 tablet  3  . niacin 500 MG CR capsule Take 500 mg by mouth at bedtime.        [name] Kitchen omeprazole (PRILOSEC OTC) 20 MG tablet Take 20 mg by mouth daily.        . Red Yeast Rice 600 MG CAPS Take by mouth.        . triamcinolone ointment (KENALOG) 0.1 % APPLY AT BEDTIME AS NEEDED  60 g  0  . vitamin C (ASCORBIC ACID) 500 MG tablet Take 500 mg by mouth daily.        [name] Kitchen zinc gluconate 50 MG tablet Take 50 mg by mouth daily.        . Garlic 1000 MG CAPS Take by mouth.        [name] Kitchen KRILL OIL 1000 MG CAPS Take by mouth.           BP 144/82  Pulse 46  Ht 5\' 11"  (1.803 m)  Wt 88.451 kg (195 lb)  BMI 27.20 kg/m2 General: NAD Neck: No JVD, no thyromegaly or thyroid nodule.  Lungs: Clear to auscultation bilaterally with normal respiratory effort. CV: Nondisplaced PMI.  Heart regular S1/S2, no S3/S4, no murmur.  No peripheral edema.  No carotid bruit.  Normal pedal pulses.  Abdomen: Soft, nontender, no hepatosplenomegaly, no distention.  Neurologic: Alert and oriented x 3.  Psych: Normal affect. Extremities: No clubbing or cyanosis.

## 2011-07-22 NOTE — Assessment & Plan Note (Signed)
Paroxysmal.  He had a prolonged, symptomatic episode that resolved after several days in 6/12.  He remains in NSR today as well as at prior appointments.  CHADSVASC score = 1 (HTN), so he is on ASA rather than on coumadin. He needs to avoid stimulant-containing decongestants (may have triggered his episode).  He can use coricidin or Atrovent nasal spray.  He should continue to moderate his ETOH intake.  He does not screen positive for OSA.   Continue Toprol XL.  HR is in the high 40s at times at rest but he has no lightheadedness and HR augments normally with exercise.

## 2011-09-28 ENCOUNTER — Other Ambulatory Visit: Payer: Self-pay | Admitting: Family Medicine

## 2011-10-11 ENCOUNTER — Other Ambulatory Visit: Payer: Self-pay | Admitting: Family Medicine

## 2011-10-17 ENCOUNTER — Other Ambulatory Visit (INDEPENDENT_AMBULATORY_CARE_PROVIDER_SITE_OTHER): Payer: BC Managed Care – PPO

## 2011-10-17 DIAGNOSIS — Z0000 Encounter for general adult medical examination without abnormal findings: Secondary | ICD-10-CM

## 2011-10-17 LAB — CBC WITH DIFFERENTIAL/PLATELET
Eosinophils Absolute: 0.3 10*3/uL (ref 0.0–0.7)
Eosinophils Relative: 4.4 % (ref 0.0–5.0)
MCV: 91 fl (ref 78.0–100.0)
Monocytes Absolute: 0.5 10*3/uL (ref 0.1–1.0)
Neutrophils Relative %: 56.3 % (ref 43.0–77.0)
Platelets: 140 10*3/uL — ABNORMAL LOW (ref 150.0–400.0)
WBC: 5.8 10*3/uL (ref 4.5–10.5)

## 2011-10-17 LAB — POCT URINALYSIS DIPSTICK
Bilirubin, UA: NEGATIVE
Blood, UA: NEGATIVE
Glucose, UA: NEGATIVE
Ketones, UA: NEGATIVE
Leukocytes, UA: NEGATIVE
Nitrite, UA: NEGATIVE
Protein, UA: NEGATIVE
Spec Grav, UA: 1.025
Urobilinogen, UA: 0.2
pH, UA: 5.5

## 2011-10-17 LAB — LIPID PANEL
Cholesterol: 146 mg/dL (ref 0–200)
HDL: 37.5 mg/dL — ABNORMAL LOW (ref >39.00)
LDL Cholesterol: 89 mg/dL (ref 0–99)
Total CHOL/HDL Ratio: 4
Triglycerides: 96 mg/dL (ref 0.0–149.0)
VLDL: 19.2 mg/dL (ref 0.0–40.0)

## 2011-10-17 LAB — PSA: PSA: 2.96 ng/mL (ref 0.10–4.00)

## 2011-10-17 LAB — HEPATIC FUNCTION PANEL
ALT: 29 U/L (ref 0–53)
Bilirubin, Direct: 0.1 mg/dL (ref 0.0–0.3)
Total Bilirubin: 0.7 mg/dL (ref 0.3–1.2)

## 2011-10-17 LAB — BASIC METABOLIC PANEL
BUN: 23 mg/dL (ref 6–23)
Chloride: 104 mEq/L (ref 96–112)
Creatinine, Ser: 1 mg/dL (ref 0.4–1.5)

## 2011-10-17 LAB — TSH: TSH: 1.49 u[IU]/mL (ref 0.35–5.50)

## 2011-10-24 ENCOUNTER — Ambulatory Visit (INDEPENDENT_AMBULATORY_CARE_PROVIDER_SITE_OTHER): Payer: BC Managed Care – PPO | Admitting: Family Medicine

## 2011-10-24 ENCOUNTER — Encounter: Payer: Self-pay | Admitting: Family Medicine

## 2011-10-24 ENCOUNTER — Other Ambulatory Visit: Payer: Self-pay | Admitting: *Deleted

## 2011-10-24 VITALS — BP 146/80 | HR 58 | Temp 98.2°F | Resp 16 | Ht 71.0 in | Wt 189.0 lb

## 2011-10-24 DIAGNOSIS — Z23 Encounter for immunization: Secondary | ICD-10-CM

## 2011-10-24 DIAGNOSIS — L309 Dermatitis, unspecified: Secondary | ICD-10-CM

## 2011-10-24 DIAGNOSIS — M25562 Pain in left knee: Secondary | ICD-10-CM | POA: Insufficient documentation

## 2011-10-24 DIAGNOSIS — L259 Unspecified contact dermatitis, unspecified cause: Secondary | ICD-10-CM

## 2011-10-24 DIAGNOSIS — I429 Cardiomyopathy, unspecified: Secondary | ICD-10-CM

## 2011-10-24 DIAGNOSIS — I4891 Unspecified atrial fibrillation: Secondary | ICD-10-CM

## 2011-10-24 DIAGNOSIS — M25569 Pain in unspecified knee: Secondary | ICD-10-CM

## 2011-10-24 DIAGNOSIS — J309 Allergic rhinitis, unspecified: Secondary | ICD-10-CM

## 2011-10-24 DIAGNOSIS — J45909 Unspecified asthma, uncomplicated: Secondary | ICD-10-CM

## 2011-10-24 DIAGNOSIS — I10 Essential (primary) hypertension: Secondary | ICD-10-CM

## 2011-10-24 DIAGNOSIS — I428 Other cardiomyopathies: Secondary | ICD-10-CM

## 2011-10-24 MED ORDER — LOSARTAN POTASSIUM 100 MG PO TABS: 100.0000 mg | ORAL_TABLET | Freq: Every day | ORAL | Status: DC

## 2011-10-24 MED ORDER — MONTELUKAST SODIUM 10 MG PO TABS: 10.0000 mg | ORAL_TABLET | Freq: Every day | ORAL | Status: DC

## 2011-10-24 MED ORDER — TRIAMCINOLONE ACETONIDE 0.1 % EX OINT: TOPICAL_OINTMENT | Freq: Two times a day (BID) | CUTANEOUS | Status: DC

## 2011-10-24 MED ORDER — FLUTICASONE PROPIONATE 50 MCG/ACT NA SUSP: 1.0000 | Freq: Every day | NASAL | Status: DC

## 2011-10-24 MED ORDER — METOPROLOL SUCCINATE ER 50 MG PO TB24: 50.0000 mg | ORAL_TABLET | Freq: Every day | ORAL | Status: DC

## 2011-10-24 NOTE — Progress Notes (Signed)
  Subjective:    Patient ID: Alvin Morrison, male    DOB: 08-Feb-1955, 56 y.o.   MRN: [id number]  HPI Alvin Morrison is a 56 year old single male nonsmoker who comes in today for a physical examination because of a history of hypertension, allergic rhinitis, asthma, cardiomyopathy, atrial fibrillation, and a new problem of pain in his left knee  His medications reviewed there've been no changes.  He states he's been in trouble with his left knee for about 6 months no history of trauma except many many years ago playing softball he had some, and was hit on the outside of that knee.  He has no swelling or locking. He is currently building a cabin. He is retired Psychologist, prison and probation services booster 2011 seasonal flu shot 2013  He is originally from [place]......... in the 1700s his family had about 10,000 acres of land that there the now down to about 300 he has a big parcel on which he  is building a cabin   Review of Systems  Constitutional: Negative.   HENT: Negative.   Eyes: Negative.   Respiratory: Negative.   Cardiovascular: Negative.   Gastrointestinal: Negative.   Genitourinary: Negative.   Musculoskeletal: Negative.   Skin: Negative.   Neurological: Negative.   Hematological: Negative.   Psychiatric/Behavioral: Negative.        Objective:   Physical Exam  Constitutional: He is oriented to person, place, and time. He appears well-developed and well-nourished.  HENT:  Head: Normocephalic and atraumatic.  Right Ear: External ear normal.  Left Ear: External ear normal.  Nose: Nose normal.  Mouth/Throat: Oropharynx is clear and moist.  Eyes: Conjunctivae normal and EOM are normal. Pupils are equal, round, and reactive to light.  Neck: Normal range of motion. Neck supple. No JVD present. No tracheal deviation present. No thyromegaly present.  Cardiovascular: Normal rate, regular rhythm, normal heart sounds and intact distal pulses.  Exam reveals no gallop and no friction rub.     No murmur heard. Pulmonary/Chest: Effort normal and breath sounds normal. No stridor. No respiratory distress. He has no wheezes. He has no rales. He exhibits no tenderness.  Abdominal: Soft. Bowel sounds are normal. He exhibits no distension and no mass. There is no tenderness. There is no rebound and no guarding.  Genitourinary: Rectum normal, prostate normal and penis normal. Guaiac negative stool. No penile tenderness.  Musculoskeletal: Normal range of motion. He exhibits no edema and no tenderness.       Both knees appear normal. Ligaments on both knees are intact he has some tenderness no lateral joint line of the left knee is probably wearing off that lateral cartilage.  Lymphadenopathy:    He has no cervical adenopathy.  Neurological: He is alert and oriented to person, place, and time. He has normal reflexes. No cranial nerve deficit. He exhibits normal muscle tone.  Skin: Skin is warm and dry. No rash noted. No erythema. No pallor.  Psychiatric: He has a normal mood and affect. His behavior is normal. Judgment and thought content normal.          Assessment & Plan:  Healthy male  History of atrial fib currently asymptomatic  Hypertension continue Toprol and Cozaar  Allergic rhinitis continue Singulair and OTC Zyrtec  Eczema continue Kenalog cream when necessary  Pain left knee probable degenerative cartilage recommend Motrin 600 twice a day elevation and ice when necessary

## 2011-10-24 NOTE — Patient Instructions (Signed)
Continue your current medications  Motrin 600 mg twice daily with food elevation and ice for your left knee pain  Return in one year sooner if any problems

## 2012-04-24 ENCOUNTER — Encounter: Payer: Self-pay | Admitting: *Deleted

## 2012-04-24 ENCOUNTER — Encounter (INDEPENDENT_AMBULATORY_CARE_PROVIDER_SITE_OTHER): Payer: BC Managed Care – PPO

## 2012-04-24 ENCOUNTER — Telehealth: Payer: Self-pay | Admitting: Cardiology

## 2012-04-24 ENCOUNTER — Ambulatory Visit (INDEPENDENT_AMBULATORY_CARE_PROVIDER_SITE_OTHER): Payer: BC Managed Care – PPO | Admitting: Cardiology

## 2012-04-24 ENCOUNTER — Encounter: Payer: Self-pay | Admitting: Cardiology

## 2012-04-24 VITALS — BP 132/62 | HR 65 | Ht 71.0 in | Wt 185.0 lb

## 2012-04-24 DIAGNOSIS — I429 Cardiomyopathy, unspecified: Secondary | ICD-10-CM

## 2012-04-24 DIAGNOSIS — I428 Other cardiomyopathies: Secondary | ICD-10-CM

## 2012-04-24 DIAGNOSIS — I4891 Unspecified atrial fibrillation: Secondary | ICD-10-CM

## 2012-04-24 LAB — CBC WITH DIFFERENTIAL/PLATELET
Basophils Absolute: 0 10*3/uL (ref 0.0–0.1)
Eosinophils Absolute: 0.2 10*3/uL (ref 0.0–0.7)
HCT: 44.7 % (ref 39.0–52.0)
Lymphs Abs: 2.3 10*3/uL (ref 0.7–4.0)
MCHC: 33.3 g/dL (ref 30.0–36.0)
Monocytes Absolute: 0.7 10*3/uL (ref 0.1–1.0)
Monocytes Relative: 8.7 % (ref 3.0–12.0)
Platelets: 172 10*3/uL (ref 150.0–400.0)
RDW: 13.3 % (ref 11.5–14.6)

## 2012-04-24 LAB — BASIC METABOLIC PANEL
BUN: 21 mg/dL (ref 6–23)
GFR: 74.6 mL/min (ref >60.00)
Glucose, Bld: 92 mg/dL (ref 70–99)
Potassium: 4.6 mEq/L (ref 3.5–5.1)

## 2012-04-24 LAB — TSH: TSH: 0.67 u[IU]/mL (ref 0.35–5.50)

## 2012-04-24 MED ORDER — METOPROLOL SUCCINATE ER 50 MG PO TB24: 50.0000 mg | ORAL_TABLET | Freq: Two times a day (BID) | ORAL | Status: DC

## 2012-04-24 MED ORDER — APIXABAN 5 MG PO TABS: 5.0000 mg | ORAL_TABLET | Freq: Two times a day (BID) | ORAL | Status: DC

## 2012-04-24 NOTE — Telephone Encounter (Signed)
New problem   Pt is experiencing Afib and want to get in to see Dr Shirlee Latch. Please call pt,

## 2012-04-24 NOTE — Patient Instructions (Addendum)
Increase Toprol XL (metoprolol succinate) to 50mg  two times a day.   Stop aspirin.  Start Eliquis 5mg  two times a day.  Your physician recommends that you have  lab work today--BMET/TSH/CBCd.   Your physician has requested that you have a TEE/Cardioversion. During a TEE, sound waves are used to create images of your heart. It provides your doctor with information about the size and shape of your heart and how well your heart's chambers and valves are working. In this test, a transducer is attached to the end of a flexible tube that is guided down you throat and into your esophagus (the tube leading from your mouth to your stomach) to get a more detailed image of your heart. Once the TEE has determined that a blood clot is not present, the cardioversion begins. Electrical Cardioversion uses a jolt of electricity to your heart either through paddles or wired patches attached to your chest. This is a controlled, usually prescheduled, procedure. This procedure is done at the hospital and you are not awake during the procedure. You usually go home the day of the procedure. Please see the instruction sheet given to you today for more information.  Tuesday April 15,2014.  Your physician recommends that you schedule an appointment for an EKG before you go to the hospital for the TEE/Cardioversion on Tuesday.    Your physician recommends that you schedule a follow-up appointment in: 3 weeks with PA/NP.

## 2012-04-24 NOTE — Telephone Encounter (Signed)
Spoke with patient. Pt states since Saturday or Sunday his heart rate has been varying from 50s-80s. This is unusual for him. He states his BP has been OK and his only other symptom is some dizziness when he bends over. He feels like he may be in and out of at fib since Sunday. I will forward to Dr Shirlee Latch for review.

## 2012-04-24 NOTE — Progress Notes (Signed)
Patient ID: Alvin Morrison, male   DOB: [birth year], 57 y.o.   MRN: [id number] PCP: Dr. Tawanna Cooler  57 yo with history of HTN is seen in return visit for evaluation of atrial fibrillation.  Patient saw Dr. Tawanna Cooler in 6/12 and was noted to be in atrial fibrillation with rapid response.  He had been feeling fatigued and anxious for 3-4 days prior to this and felt his heart racing.  He had some lightheadedness and also noted that his systolic blood pressure was running in the 90s-100s, which is very low for him.  No dyspnea, no chest pain.  Prior to the onset of symptoms, he had been taking a decongestant from [employer] (he is not sure which one) for his chronic nasal congestion.  He was back in sinus rhythm by the time he saw me in the office. CHADSVASC score was 1 so I started him on ASA 325 mg daily.  He was noted by echo to have mildly decreased LV systolic function.  Myoview showed no evidence for ischemia or infarction.  Recent repeat echo in 6/13 showed EF 50-55%.    Patient had no tachypalpitations suggestive of recurrent atrial fibrillation from 6/12 until this past Monday.  On Monday, he started to feel palpitations and a rapid heart rate on Monday.  He felt generally "uncomfortable" and mildly lightheaded.  This has continued until today.  He is in atrial fibrillation with a rate of 108 today. He has been checking his BP at home and it has been ok. No chest pain or exertional dyspnea.  He has had no definite predisposing event to this atrial fibrillation episode.  He did have some allergic rhinitis - type sinus congestion over the weekend but did not take any stimulant-type medication.    ECG: Atrial fibrillation with RVR at 108.   Labs (8/11): LDL 82, HDL 38, K 4.1, creatinine 1.0 Labs (6/12): TSH normal, HCT 44.3 Labs (9/12): K 5.4, creatinine 1.1, LDL 102, HDL 38.3, TSH normal Labs (10/12): K 4.5, creatinine 1.1 Labs (10/13): LDL 89, HDL 37.5  PMH:  1. Allergic rhinitis 2. HTN 3. Atrial  fibrillation: first noted in 6/12.  Recurrence in 4/14.  4. Appendectomy 5. Sinus surgery 6. Cardiomyopathy: Echo (6/12) with EF 45-50%, mild diffuse hypokinesis worse in the septum, mild MR, mild to moderate LAE.  ETT-myoview (7/12): 14'15" exercise, stopped due to dyspnea, no ECG changes, no evidence for ischemia or infarction, EF 56%.  Echo (6/13): EF 50-55%, no regional wall motion abnormalities.   FH: mother with rheumatic heart disease, CVA.  Son developed atrial fibrillation at age 74 and has had DCCV x 2.   SH: Lives in [place] with wife.  Retired Futures trader.  Nonsmoker.  Drinks 1-2 glasses wine/day on weekdays, 3-5 drinks at day on weekends.    ROS: All systems reviewed and negative except as per HPI.   Current Outpatient Prescriptions  Medication Sig Dispense Refill  . calcium citrate-vitamin D (CITRACAL+D) 315-200 MG-UNIT per tablet Take 1 tablet by mouth daily.        . cetirizine (ZYRTEC) 10 MG tablet Take 10 mg by mouth daily.        . fluticasone (FLONASE) 50 MCG/ACT nasal spray Place 1 spray into the nose daily.  16 g  10  . Garlic 1000 MG CAPS Take by mouth.        . Iron-Vitamins (GERITOL PO) Take 1 tablet by mouth.        . losartan (COZAAR) 100 MG  tablet Take 1 tablet (100 mg total) by mouth daily.  100 tablet  3  . montelukast (SINGULAIR) 10 MG tablet Take 1 tablet (10 mg total) by mouth at bedtime.  100 tablet  3  . niacin 500 MG CR capsule Take 500 mg by mouth at bedtime.        [name] Kitchen omeprazole (PRILOSEC OTC) 20 MG tablet Take 20 mg by mouth daily.        [name] Kitchen triamcinolone ointment (KENALOG) 0.1 % Apply topically 2 (two) times daily.  60 g  3  . vitamin C (ASCORBIC ACID) 500 MG tablet Take 500 mg by mouth daily.        [name] Kitchen zinc gluconate 50 MG tablet Take 50 mg by mouth daily.        [name] Kitchen apixaban (ELIQUIS) 5 MG TABS tablet Take 1 tablet (5 mg total) by mouth 2 (two) times daily.  60 tablet  3  . metoprolol succinate (TOPROL-XL) 50 MG 24 hr tablet Take 1 tablet (50 mg  total) by mouth 2 (two) times daily. Take with or immediately following a meal.  60 tablet  3   No current facility-administered medications for this visit.    BP 132/62  Pulse 65  Ht 5\' 11"  (1.803 m)  Wt 185 lb (83.915 kg)  BMI 25.81 kg/m2 General: NAD Neck: No JVD, no thyromegaly or thyroid nodule.  Lungs: Clear to auscultation bilaterally with normal respiratory effort. CV: Nondisplaced PMI.  Heart mildly tachy, irregular S1/S2, no S3/S4, no murmur.  No peripheral edema.  No carotid bruit.  Normal pedal pulses.  Abdomen: Soft, nontender, no hepatosplenomegaly, no distention.  Neurologic: Alert and oriented x 3.  Psych: Normal affect. Extremities: No clubbing or cyanosis.   Assessment/Plan: 1. Atrial fibrillation: Recurrence of atrial fibrillation with RVR.  This began 3 days ago and has been persistent.  He feels generally weak and occasionally lightheaded. BP has not been low.  No chest pain or dyspnea.  Symptomatically, I think that he would benefit from return to NSR.  - I will increase Toprol XL for now to 50 mg bid.   - I talked to him about risks/benefits of cardioversion.  As he has been in atrial fibrillation for > 48 hrs, he would need TEE-guided cardioversion unless we were to anticoagulate him for 3 wks prior to cardioversion.  He is symptomatic and does not want to wait 3 wks.  I will start him on apixaban 5 mg bid today and will see him back next Tuesday.  If he remains in atrial fibrillation, I will do a TEE-guided cardioversion.   - Check CBC, BMET, TSH today.  - I would like to try to avoid anti-arrhythmic use as this is just his second episode of atrial fibrillation in the last 2 years.  We could consider dronedarone.  2. Cardiomyopathy:   EF 45-50% on echo in 2012 with septal hypokinesis. Normal myoview with excellent exercise tolerance and no evidence for ischemia or infarction. EF was 56% on myoview. EF on that particular echo may have been decreased in setting of  recent episode of prolonged atrial fibrillation with rapid response. Repeat echo in 6/13 showed EF 50-55%, so no worsening.   Marca Ancona 04/24/2012 12:56 PM

## 2012-04-24 NOTE — Telephone Encounter (Signed)
EKG done

## 2012-04-24 NOTE — Telephone Encounter (Signed)
Per Dr Shirlee Latch. Pt should come to office for an EKG. Pt will come to office now for EKG.

## 2012-04-29 ENCOUNTER — Encounter (HOSPITAL_COMMUNITY): Payer: Self-pay | Admitting: Certified Registered Nurse Anesthetist

## 2012-04-29 ENCOUNTER — Ambulatory Visit (HOSPITAL_COMMUNITY): Payer: BC Managed Care – PPO | Admitting: Certified Registered Nurse Anesthetist

## 2012-04-29 ENCOUNTER — Ambulatory Visit (INDEPENDENT_AMBULATORY_CARE_PROVIDER_SITE_OTHER): Payer: BC Managed Care – PPO | Admitting: *Deleted

## 2012-04-29 ENCOUNTER — Encounter (HOSPITAL_COMMUNITY): Payer: Self-pay | Admitting: Gastroenterology

## 2012-04-29 ENCOUNTER — Ambulatory Visit (HOSPITAL_COMMUNITY)
Admission: RE | Admit: 2012-04-29 | Discharge: 2012-04-29 | Disposition: A | Discharge status: Home / Self Care | Payer: BC Managed Care – PPO | Source: Ambulatory Visit | Attending: Cardiology | Admitting: Cardiology

## 2012-04-29 ENCOUNTER — Encounter (HOSPITAL_COMMUNITY)
Admission: RE | Disposition: A | Discharge status: Home / Self Care | Payer: Self-pay | Source: Ambulatory Visit | Attending: Cardiology

## 2012-04-29 ENCOUNTER — Other Ambulatory Visit: Payer: Self-pay | Admitting: Cardiology

## 2012-04-29 VITALS — BP 128/64 | HR 81 | Ht 71.0 in | Wt 185.0 lb

## 2012-04-29 DIAGNOSIS — I4891 Unspecified atrial fibrillation: Secondary | ICD-10-CM

## 2012-04-29 DIAGNOSIS — J309 Allergic rhinitis, unspecified: Secondary | ICD-10-CM | POA: Insufficient documentation

## 2012-04-29 DIAGNOSIS — Z79899 Other long term (current) drug therapy: Secondary | ICD-10-CM | POA: Insufficient documentation

## 2012-04-29 DIAGNOSIS — I428 Other cardiomyopathies: Secondary | ICD-10-CM | POA: Insufficient documentation

## 2012-04-29 DIAGNOSIS — I10 Essential (primary) hypertension: Secondary | ICD-10-CM | POA: Insufficient documentation

## 2012-04-29 HISTORY — PX: TEE WITHOUT CARDIOVERSION: SHX5443

## 2012-04-29 HISTORY — DX: Gastro-esophageal reflux disease without esophagitis: K21.9

## 2012-04-29 HISTORY — PX: CARDIOVERSION: SHX1299

## 2012-04-29 SURGERY — CARDIOVERSION
Anesthesia: Monitor Anesthesia Care

## 2012-04-29 SURGERY — ECHOCARDIOGRAM, TRANSESOPHAGEAL
Anesthesia: Moderate Sedation

## 2012-04-29 SURGERY — ECHOCARDIOGRAM, TRANSESOPHAGEAL
Anesthesia: Monitor Anesthesia Care

## 2012-04-29 MED ORDER — SODIUM CHLORIDE 0.9 % IV SOLN
INTRAVENOUS | Status: DC | PRN
  Administered 2012-04-29: 12:00:00 via INTRAVENOUS

## 2012-04-29 MED ORDER — SODIUM CHLORIDE 0.9 % IJ SOLN: 3.0000 mL | INTRAMUSCULAR | Status: DC | PRN

## 2012-04-29 MED ORDER — METOPROLOL SUCCINATE ER 50 MG PO TB24: 50.0000 mg | ORAL_TABLET | Freq: Every day | ORAL | Status: DC

## 2012-04-29 MED ORDER — LIDOCAINE HCL (CARDIAC) 20 MG/ML IV SOLN
INTRAVENOUS | Status: DC | PRN
  Administered 2012-04-29: 100 mg via INTRAVENOUS

## 2012-04-29 MED ORDER — SODIUM CHLORIDE 0.9 % IJ SOLN: 3.0000 mL | Freq: Two times a day (BID) | INTRAMUSCULAR | Status: DC

## 2012-04-29 MED ORDER — PROPOFOL 10 MG/ML IV BOLUS
INTRAVENOUS | Status: DC | PRN
  Administered 2012-04-29: 100 mg via INTRAVENOUS

## 2012-04-29 MED ORDER — FENTANYL CITRATE 0.05 MG/ML IJ SOLN
INTRAMUSCULAR | Status: AC
  Filled 2012-04-29: qty 2

## 2012-04-29 MED ORDER — MIDAZOLAM HCL 10 MG/2ML IJ SOLN
INTRAMUSCULAR | Status: DC | PRN
  Administered 2012-04-29 (×2): 2 mg via INTRAVENOUS

## 2012-04-29 MED ORDER — FENTANYL CITRATE 0.05 MG/ML IJ SOLN
INTRAMUSCULAR | Status: DC | PRN
  Administered 2012-04-29 (×2): 25 ug via INTRAVENOUS

## 2012-04-29 MED ORDER — HYDROCORTISONE 1 % EX CREA
1.0000 "application " | TOPICAL_CREAM | Freq: Three times a day (TID) | CUTANEOUS | Status: DC | PRN
  Filled 2012-04-29: qty 28

## 2012-04-29 MED ORDER — SODIUM CHLORIDE 0.9 % IV SOLN: 250.0000 mL | INTRAVENOUS | Status: DC

## 2012-04-29 MED ORDER — BUTAMBEN-TETRACAINE-BENZOCAINE 2-2-14 % EX AERO
INHALATION_SPRAY | CUTANEOUS | Status: DC | PRN
  Administered 2012-04-29: 2 via TOPICAL

## 2012-04-29 MED ORDER — SODIUM CHLORIDE 0.9 % IV SOLN
INTRAVENOUS | Status: DC
  Administered 2012-04-29: 500 mL via INTRAVENOUS

## 2012-04-29 MED ORDER — MIDAZOLAM HCL 5 MG/ML IJ SOLN
INTRAMUSCULAR | Status: AC
  Filled 2012-04-29: qty 2

## 2012-04-29 NOTE — H&P (View-Only) (Signed)
Patient ID: Kaston M Nguyen, male   DOB: 10/06/1955, 57 y.o.   MRN: [id number] PCP: Dr. Todd  57 yo with history of HTN is seen in return visit for evaluation of atrial fibrillation.  Patient saw Dr. Todd in 6/12 and was noted to be in atrial fibrillation with rapid response.  He had been feeling fatigued and anxious for 3-4 days prior to this and felt his heart racing.  He had some lightheadedness and also noted that his systolic blood pressure was running in the 90s-100s, which is very low for him.  No dyspnea, no chest pain.  Prior to the onset of symptoms, he had been taking a decongestant from [employer] (he is not sure which one) for his chronic nasal congestion.  He was back in sinus rhythm by the time he saw me in the office. CHADSVASC score was 1 so I started him on ASA 325 mg daily.  He was noted by echo to have mildly decreased LV systolic function.  Myoview showed no evidence for ischemia or infarction.  Recent repeat echo in 6/13 showed EF 50-55%.    Patient had no tachypalpitations suggestive of recurrent atrial fibrillation from 6/12 until this past Monday.  On Monday, he started to feel palpitations and a rapid heart rate on Monday.  He felt generally "uncomfortable" and mildly lightheaded.  This has continued until today.  He is in atrial fibrillation with a rate of 108 today. He has been checking his BP at home and it has been ok. No chest pain or exertional dyspnea.  He has had no definite predisposing event to this atrial fibrillation episode.  He did have some allergic rhinitis - type sinus congestion over the weekend but did not take any stimulant-type medication.    ECG: Atrial fibrillation with RVR at 108.   Labs (8/11): LDL 82, HDL 38, K 4.1, creatinine 1.0 Labs (6/12): TSH normal, HCT 44.3 Labs (9/12): K 5.4, creatinine 1.1, LDL 102, HDL 38.3, TSH normal Labs (10/12): K 4.5, creatinine 1.1 Labs (10/13): LDL 89, HDL 37.5  PMH:  1. Allergic rhinitis 2. HTN 3. Atrial  fibrillation: first noted in 6/12.  Recurrence in 4/14.  4. Appendectomy 5. Sinus surgery 6. Cardiomyopathy: Echo (6/12) with EF 45-50%, mild diffuse hypokinesis worse in the septum, mild MR, mild to moderate LAE.  ETT-myoview (7/12): 14'15" exercise, stopped due to dyspnea, no ECG changes, no evidence for ischemia or infarction, EF 56%.  Echo (6/13): EF 50-55%, no regional wall motion abnormalities.   FH: mother with rheumatic heart disease, CVA.  Son developed atrial fibrillation at age 25 and has had DCCV x 2.   SH: Lives in [place] with wife.  Retired police detective.  Nonsmoker.  Drinks 1-2 glasses wine/day on weekdays, 3-5 drinks at day on weekends.    ROS: All systems reviewed and negative except as per HPI.   Current Outpatient Prescriptions  Medication Sig Dispense Refill  . calcium citrate-vitamin D (CITRACAL+D) 315-200 MG-UNIT per tablet Take 1 tablet by mouth daily.        . cetirizine (ZYRTEC) 10 MG tablet Take 10 mg by mouth daily.        . fluticasone (FLONASE) 50 MCG/ACT nasal spray Place 1 spray into the nose daily.  16 g  10  . Garlic 1000 MG CAPS Take by mouth.        . Iron-Vitamins (GERITOL PO) Take 1 tablet by mouth.        . losartan (COZAAR) 100 MG   tablet Take 1 tablet (100 mg total) by mouth daily.  100 tablet  3  . montelukast (SINGULAIR) 10 MG tablet Take 1 tablet (10 mg total) by mouth at bedtime.  100 tablet  3  . niacin 500 MG CR capsule Take 500 mg by mouth at bedtime.        . omeprazole (PRILOSEC OTC) 20 MG tablet Take 20 mg by mouth daily.        . triamcinolone ointment (KENALOG) 0.1 % Apply topically 2 (two) times daily.  60 g  3  . vitamin C (ASCORBIC ACID) 500 MG tablet Take 500 mg by mouth daily.        . zinc gluconate 50 MG tablet Take 50 mg by mouth daily.        . apixaban (ELIQUIS) 5 MG TABS tablet Take 1 tablet (5 mg total) by mouth 2 (two) times daily.  60 tablet  3  . metoprolol succinate (TOPROL-XL) 50 MG 24 hr tablet Take 1 tablet (50 mg  total) by mouth 2 (two) times daily. Take with or immediately following a meal.  60 tablet  3   No current facility-administered medications for this visit.    BP 132/62  Pulse 65  Ht 5' 11" (1.803 m)  Wt 185 lb (83.915 kg)  BMI 25.81 kg/m2 General: NAD Neck: No JVD, no thyromegaly or thyroid nodule.  Lungs: Clear to auscultation bilaterally with normal respiratory effort. CV: Nondisplaced PMI.  Heart mildly tachy, irregular S1/S2, no S3/S4, no murmur.  No peripheral edema.  No carotid bruit.  Normal pedal pulses.  Abdomen: Soft, nontender, no hepatosplenomegaly, no distention.  Neurologic: Alert and oriented x 3.  Psych: Normal affect. Extremities: No clubbing or cyanosis.   Assessment/Plan: 1. Atrial fibrillation: Recurrence of atrial fibrillation with RVR.  This began 3 days ago and has been persistent.  He feels generally weak and occasionally lightheaded. BP has not been low.  No chest pain or dyspnea.  Symptomatically, I think that he would benefit from return to NSR.  - I will increase Toprol XL for now to 50 mg bid.   - I talked to him about risks/benefits of cardioversion.  As he has been in atrial fibrillation for > 48 hrs, he would need TEE-guided cardioversion unless we were to anticoagulate him for 3 wks prior to cardioversion.  He is symptomatic and does not want to wait 3 wks.  I will start him on apixaban 5 mg bid today and will see him back next Tuesday.  If he remains in atrial fibrillation, I will do a TEE-guided cardioversion.   - Check CBC, BMET, TSH today.  - I would like to try to avoid anti-arrhythmic use as this is just his second episode of atrial fibrillation in the last 2 years.  We could consider dronedarone.  2. Cardiomyopathy:   EF 45-50% on echo in 2012 with septal hypokinesis. Normal myoview with excellent exercise tolerance and no evidence for ischemia or infarction. EF was 56% on myoview. EF on that particular echo may have been decreased in setting of  recent episode of prolonged atrial fibrillation with rapid response. Repeat echo in 6/13 showed EF 50-55%, so no worsening.   Leahmarie Gasiorowski 04/24/2012 12:56 PM   

## 2012-04-29 NOTE — Anesthesia Postprocedure Evaluation (Signed)
  Anesthesia Post-op Note  Patient: Alvin Morrison  Procedure(s) Performed: Procedure(s): TRANSESOPHAGEAL ECHOCARDIOGRAM (TEE) (N/A) CARDIOVERSION (N/A)  Patient Location: Endoscopy Unit  Anesthesia Type:MAC  Level of Consciousness: awake and patient cooperative  Airway and Oxygen Therapy: Patient Spontanous Breathing and Patient connected to nasal cannula oxygen  Post-op Pain: none  Post-op Assessment: Post-op Vital signs reviewed, Patient's Cardiovascular Status Stable, Respiratory Function Stable, Patent Airway, No signs of Nausea or vomiting, Adequate PO intake and Pain level controlled  Post-op Vital Signs: Reviewed and stable  Complications: No apparent anesthesia complications

## 2012-04-29 NOTE — Anesthesia Preprocedure Evaluation (Addendum)
Anesthesia Evaluation  Patient identified by MRN, date of birth, ID band Patient awake    Reviewed: Allergy & Precautions, H&P , NPO status , Patient's Chart, lab work & pertinent test results, reviewed documented beta blocker date and time   History of Anesthesia Complications Negative for: history of anesthetic complications  Airway Mallampati: II TM Distance: >3 FB Neck ROM: Full    Dental   Pulmonary asthma ,  History of viral pneumonia breath sounds clear to auscultation        Cardiovascular hypertension, Pt. on medications and Pt. on home beta blockers + dysrhythmias Atrial Fibrillation Rhythm:Irregular Rate:Tachycardia  Cardiomyopathy 06/2011 Echo:  EF 50-55%   Neuro/Psych negative neurological ROS  negative psych ROS   GI/Hepatic Neg liver ROS, GERD-  Medicated and Controlled,  Endo/Other  negative endocrine ROS  Renal/GU negative Renal ROS     Musculoskeletal negative musculoskeletal ROS (+)   Abdominal   Peds  Hematology negative hematology ROS (+)   Anesthesia Other Findings   Reproductive/Obstetrics                          Anesthesia Physical Anesthesia Plan  ASA: III  Anesthesia Plan: MAC   Post-op Pain Management:    Induction: Intravenous  Airway Management Planned: Simple Face Mask  Additional Equipment:   Intra-op Plan:   Post-operative Plan:   Informed Consent: I have reviewed the patients History and Physical, chart, labs and discussed the procedure including the risks, benefits and alternatives for the proposed anesthesia with the patient or authorized representative who has indicated his/her understanding and acceptance.   Dental advisory given  Plan Discussed with: CRNA and Anesthesiologist  Anesthesia Plan Comments:         Anesthesia Quick Evaluation

## 2012-04-29 NOTE — Interval H&P Note (Signed)
History and Physical Interval Note:  04/29/2012 12:20 PM  Alvin Morrison  has presented today for surgery, with the diagnosis of a-fib  The various methods of treatment have been discussed with the patient and family. After consideration of risks, benefits and other options for treatment, the patient has consented to  Procedure(s): TRANSESOPHAGEAL ECHOCARDIOGRAM (TEE) (N/A) CARDIOVERSION (N/A) as a surgical intervention .  The patient's history has been reviewed, patient examined, no change in status, stable for surgery.  I have reviewed the patient's chart and labs.  Questions were answered to the patient's satisfaction.     Jalien Weakland [employer]

## 2012-04-29 NOTE — CV Procedure (Signed)
Procedure: TEE  Indication: atrial fibrillation, pre-cardioversion  Sedation: Versed 4 mg IV, Fentanyl 50 mcg IV  Findings: Normal LV size with low normal to mildly decreased systolic function, EF 50-55%.  Normal RV size and systolic function.  Trivial MR.  No LAA thrombus.   May proceed to DCCV  No complications.    Marca Ancona 04/29/2012 12:43 PM

## 2012-04-29 NOTE — Transfer of Care (Signed)
Immediate Anesthesia Transfer of Care Note  Patient: Alvin Morrison  Procedure(s) Performed: Procedure(s): TRANSESOPHAGEAL ECHOCARDIOGRAM (TEE) (N/A) CARDIOVERSION (N/A)  Patient Location: Endoscopy Unit  Anesthesia Type:MAC  Level of Consciousness: awake and patient cooperative  Airway & Oxygen Therapy: Patient Spontanous Breathing and Patient connected to nasal cannula oxygen  Post-op Assessment: Report given to PACU RN, Post -op Vital signs reviewed and stable and Patient moving all extremities X 4  Post vital signs: Reviewed and stable  Complications: No apparent anesthesia complications

## 2012-04-29 NOTE — Procedures (Signed)
Electrical Cardioversion Procedure Note EURAL HOLZSCHUH [id number] [birth year]  Procedure: Electrical Cardioversion Indications:  Atrial Fibrillation.  He has been on apixaban for > 6 doses and TEE showed no LAA thrombus.    Procedure Details Consent: Risks of procedure as well as the alternatives and risks of each were explained to the (patient/caregiver).  Consent for procedure obtained. Time Out: Verified patient identification, verified procedure, site/side was marked, verified correct patient position, special equipment/implants available, medications/allergies/relevent history reviewed, required imaging and test results available.  Performed  Patient placed on cardiac monitor, pulse oximetry, supplemental oxygen as necessary.  Sedation given: Propofol per anesthesia Pacer pads placed anterior and posterior chest.  Cardioverted 1 time(s).  Cardioverted at 200J.  Evaluation Findings: Post procedure EKG shows: NSR Complications: None Patient did tolerate procedure well.  May decrease Toprol XL back to once daily.   Marca Ancona 04/29/2012, 1:00 PM

## 2012-04-29 NOTE — Preoperative (Signed)
Beta Blockers   Reason not to administer Beta Blockers:Not Applicable 

## 2012-04-29 NOTE — Progress Notes (Signed)
  Echocardiogram Echocardiogram Transesophageal has been performed.  Alvin Morrison 04/29/2012, 3:34 PM

## 2012-04-30 ENCOUNTER — Encounter (HOSPITAL_COMMUNITY): Payer: Self-pay | Admitting: Cardiology

## 2012-05-20 ENCOUNTER — Encounter: Payer: BC Managed Care – PPO | Admitting: Physician Assistant

## 2012-05-20 ENCOUNTER — Telehealth: Payer: Self-pay | Admitting: Physician Assistant

## 2012-05-20 NOTE — Telephone Encounter (Signed)
rtnd pt's call about needing to keep appt for 5/8 w/PA, advised pt since he just had DCCV he needs ekg when he comes in to make sure his heart is still in rhythm. pt asked why he was seeing a PA, I stated due to Dr schedules full, extenders help dr. Rock Nephew said ok

## 2012-05-20 NOTE — Telephone Encounter (Signed)
rtnd pt's call about needing to keep appt for 5/8 w/PA, advised pt since he just had DCCV he needs ekg when he comes in to make sure his heart is still in rhythm. pt asked why he was seeing a PA, I stated due to Dr schedules full, extenders help dr. Pt said ok 

## 2012-05-20 NOTE — Progress Notes (Signed)
done

## 2012-05-20 NOTE — Telephone Encounter (Signed)
New problem    pts wants to know if it's necessary for him to come in for the f/u on 05/22/12

## 2012-05-21 ENCOUNTER — Telehealth: Payer: Self-pay | Admitting: Cardiology

## 2012-05-21 NOTE — Telephone Encounter (Signed)
New Problem:    Patient was called and informed that due to Tereso Newcomer having a family emergency he would need to reschedule his 3 week cardioversion follow-up appointment for a second time.  Patient requested to speak with you regarding follow-up for post procedure care.  Please call back.

## 2012-05-21 NOTE — Telephone Encounter (Signed)
Discussed with Gina L. Pt will come tomorrow at 9AM. Okey Regal will perform an EKG/review meds/check VS and review with DOD. Pt OK with this. Pt does think he is in NSR.

## 2012-05-22 ENCOUNTER — Encounter: Payer: Self-pay | Admitting: Physician Assistant

## 2012-05-22 ENCOUNTER — Ambulatory Visit (INDEPENDENT_AMBULATORY_CARE_PROVIDER_SITE_OTHER): Payer: BC Managed Care – PPO | Admitting: *Deleted

## 2012-05-22 VITALS — BP 122/78 | HR 50 | Ht 72.0 in | Wt 187.4 lb

## 2012-05-22 DIAGNOSIS — I4891 Unspecified atrial fibrillation: Secondary | ICD-10-CM

## 2012-05-22 MED ORDER — APIXABAN 5 MG PO TABS: 5.0000 mg | ORAL_TABLET | Freq: Two times a day (BID) | ORAL | Status: DC

## 2012-05-22 NOTE — Progress Notes (Signed)
Pt here today for nurse visit for EKG due to recent TEE/DCCV done on 04/29/12 by Dr. Shirlee Latch @ [facility].   Vital signs today are : BP: 122/78, HR: 50, WT: 187.4 lb, Height: 6'  Pt explained to me that he took upon his self to take last dose of Eliquis 05/20/12 pm [place]. Pt also states he took upon his self to start ASA 325 mg today 05/22/12. I explained this to Dr. Jens Som about the Eliquis and ASA. Per Dr. Jens Som pt is to STOP ASA and RE-START ELIQUIS 5 mg 1 tab bid until he sees Dr. Shirlee Latch back. I explained this to the pt. Pt states to me he is not happy that he has to come back and see Dr. Shirlee Latch because  This will be another $70 co-pay. I explained the importance of re-visit due to TEE/DCCV done and the need to make sure his heart is still in rhythm. I explained that I will forward all of this to DR. McLean's RN Katina Dung for trying to fit pt in schedule to be seen since Dr. Alford Highland schedule is full and she will need to see where to have pt come in. Pt verbally gave understanding to these instructions today.  EKG done and shown to DOD Dr. Jens Som; per Dr. Jens Som EKG NSR

## 2012-05-22 NOTE — Patient Instructions (Addendum)
Per Dr. Jens Som you are to STOP aspirin today and RE-START Eliquis 5 mg 1 tab twice daily until you see DR. McLean.  I will have Dr. Alford Highland nurse Katina Dung call you with a time and date for Dr. Shirlee Latch

## 2012-06-16 ENCOUNTER — Telehealth: Payer: Self-pay | Admitting: Cardiology

## 2012-06-16 NOTE — Telephone Encounter (Signed)
New Problem:    Patient called in because he has not received a call regarding his next appointment with Dr. Shirlee Latch per his last EKG visit from 05/22/12.  Please call back.

## 2012-06-16 NOTE — Telephone Encounter (Signed)
This has been done.

## 2012-06-24 ENCOUNTER — Ambulatory Visit (INDEPENDENT_AMBULATORY_CARE_PROVIDER_SITE_OTHER): Payer: BC Managed Care – PPO | Admitting: Cardiology

## 2012-06-24 ENCOUNTER — Encounter: Payer: Self-pay | Admitting: Cardiology

## 2012-06-24 VITALS — BP 142/82 | HR 52 | Ht 72.0 in | Wt 188.0 lb

## 2012-06-24 DIAGNOSIS — I4891 Unspecified atrial fibrillation: Secondary | ICD-10-CM

## 2012-06-24 DIAGNOSIS — I429 Cardiomyopathy, unspecified: Secondary | ICD-10-CM

## 2012-06-24 DIAGNOSIS — I428 Other cardiomyopathies: Secondary | ICD-10-CM

## 2012-06-24 DIAGNOSIS — I10 Essential (primary) hypertension: Secondary | ICD-10-CM

## 2012-06-24 LAB — CBC WITH DIFFERENTIAL/PLATELET
Eosinophils Relative: 4 % (ref 0.0–5.0)
HCT: 42.9 % (ref 39.0–52.0)
Hemoglobin: 14.6 g/dL (ref 13.0–17.0)
Lymphocytes Relative: 27 % (ref 12.0–46.0)
Lymphs Abs: 1.9 10*3/uL (ref 0.7–4.0)
Monocytes Relative: 9.7 % (ref 3.0–12.0)
Platelets: 167 10*3/uL (ref 150.0–400.0)
WBC: 7.1 10*3/uL (ref 4.5–10.5)

## 2012-06-24 LAB — BASIC METABOLIC PANEL
BUN: 19 mg/dL (ref 6–23)
GFR: 84.39 mL/min (ref >60.00)
Potassium: 4 mEq/L (ref 3.5–5.1)
Sodium: 138 mEq/L (ref 135–145)

## 2012-06-24 NOTE — Progress Notes (Signed)
Patient ID: Alvin Morrison, male   DOB: 09-23-1955, 57 y.o.   MRN: [id number] PCP: Dr. Tawanna Cooler  57 yo with history of HTN and paroxysmal atrial fibrillation returns for followup.  He initially was noted to be in atrial fibrillation in 6/12.  He spontaneously converted to NSR.  CHADSVASC score was 1 so I started him on ASA.   He went back into atrial fibrillation with RVR in 4/14.  He was started on apixaban and had a TEE-guided cardioversion back to NSR.  TEE showed EF 50-55%, which is stable compared to the past.    He remains in NSR today.  No tachypalpitations.  No bleeding problems with apixaban.  No exertional chest pain or dyspnea.    ECG: NSR, normal  Labs (8/11): LDL 82, HDL 38, K 4.1, creatinine 1.0 Labs (6/12): TSH normal, HCT 44.3 Labs (9/12): K 5.4, creatinine 1.1, LDL 102, HDL 38.3, TSH normal Labs (10/12): K 4.5, creatinine 1.1 Labs (4/14): K 4.6, creatinine 1.1  PMH:  1. Allergic rhinitis 2. HTN 3. Atrial fibrillation: first noted in 6/12.  Recurrent in 4/14, had TEE-guided DCCV back to NSR.  4. Appendectomy 5. Sinus surgery 6. Cardiomyopathy: Echo (6/12) with EF 45-50%, mild diffuse hypokinesis worse in the septum, mild MR, mild to moderate LAE.  ETT-myoview (7/12): 14'15" exercise, stopped due to dyspnea, no ECG changes, no evidence for ischemia or infarction, EF 56%.  Echo (6/13): EF 50-55%, no regional wall motion abnormalities. TEE (4/14) with EF 50-55%, normal RV size/systolic function, no significant valvular abnormalities.   FH: mother with rheumatic heart disease, CVA.  Son developed atrial fibrillation at age 77 and has had DCCV x 2.   SH: Lives in [place] with wife.  Retired Futures trader.  Nonsmoker.  Drinks 1-2 glasses wine/day on weekdays, 3-5 drinks at day on weekends.     Current Outpatient Prescriptions  Medication Sig Dispense Refill  . apixaban (ELIQUIS) 5 MG TABS tablet Take 1 tablet (5 mg total) by mouth 2 (two) times daily. Per Dr. Jens Som stay on  until appt with Dr. Shirlee Latch      . calcium citrate-vitamin D (CITRACAL+D) 315-200 MG-UNIT per tablet Take 1 tablet by mouth daily.        . cetirizine (ZYRTEC) 10 MG tablet Take 10 mg by mouth daily.        . fluticasone (FLONASE) 50 MCG/ACT nasal spray Place 1 spray into the nose daily.  16 g  10  . Garlic 1000 MG CAPS Take by mouth.        . Iron-Vitamins (GERITOL PO) Take 1 tablet by mouth.        . losartan (COZAAR) 100 MG tablet Take 1 tablet (100 mg total) by mouth daily.  100 tablet  3  . metoprolol succinate (TOPROL-XL) 50 MG 24 hr tablet Take 1 tablet (50 mg total) by mouth daily. Take with or immediately following a meal.  60 tablet  3  . montelukast (SINGULAIR) 10 MG tablet Take 1 tablet (10 mg total) by mouth at bedtime.  100 tablet  3  . niacin 500 MG CR capsule Take 500 mg by mouth at bedtime.        [name] Kitchen omeprazole (PRILOSEC OTC) 20 MG tablet Take 20 mg by mouth daily.        [name] Kitchen triamcinolone ointment (KENALOG) 0.1 % Apply topically 2 (two) times daily.  60 g  3  . vitamin C (ASCORBIC ACID) 500 MG tablet Take 500 mg by mouth  daily.        . zinc gluconate 50 MG tablet Take 50 mg by mouth daily.         No current facility-administered medications for this visit.    BP 142/82  Pulse 52  Ht 6' (1.829 m)  Wt 188 lb (85.276 kg)  BMI 25.49 kg/m2 General: NAD Neck: No JVD, no thyromegaly or thyroid nodule.  Lungs: Clear to auscultation bilaterally with normal respiratory effort. CV: Nondisplaced PMI.  Heart regular S1/S2, no S3/S4, no murmur.  No peripheral edema.  No carotid bruit.  Normal pedal pulses.  Abdomen: Soft, nontender, no hepatosplenomegaly, no distention.  Neurologic: Alert and oriented x 3.  Psych: Normal affect. Extremities: No clubbing or cyanosis.   Assessment/Plan: 1. Atrial fibrillation: Paroxysmal.  DCCV back to NSR in 4/14, remains in NSR today.  As his episodes of symptomatic atrial fibrillation are rare (1-2 times/year), I have not started him on an  anti-arrhythmic medication.  CHADSVASC = 1.  We had a discussion about risk/benefit of apixaban.  I think that the bleeding risk is probably lower than warfarin and not much more than ASA.  He will continue it long-term.  Continue Toprol XL.  I will check BMET and CBC today and every 6 months with apixaban use.  2. Cardiomyopathy: Mild, EF 50-55%.  Normal myoview in 10/12.

## 2012-06-24 NOTE — Patient Instructions (Addendum)
Your physician recommends that you have lab work today--BMET/CBCd   Your physician recommends that you return for lab work in: 6 monthss--BMET/CBCd   Your physician wants you to follow-up in: 1 year with Dr Shirlee Latch. (June 2015).  You will receive a reminder letter in the mail two months in advance. If you don't receive a letter, please call our office to schedule the follow-up appointment.

## 2012-08-25 ENCOUNTER — Other Ambulatory Visit: Payer: Self-pay | Admitting: *Deleted

## 2012-08-25 DIAGNOSIS — I4891 Unspecified atrial fibrillation: Secondary | ICD-10-CM

## 2012-08-25 MED ORDER — APIXABAN 5 MG PO TABS: 5.0000 mg | ORAL_TABLET | Freq: Two times a day (BID) | ORAL | Status: DC

## 2012-10-10 ENCOUNTER — Other Ambulatory Visit: Payer: Self-pay | Admitting: *Deleted

## 2012-10-10 DIAGNOSIS — J309 Allergic rhinitis, unspecified: Secondary | ICD-10-CM

## 2012-10-10 DIAGNOSIS — J45909 Unspecified asthma, uncomplicated: Secondary | ICD-10-CM

## 2012-10-10 MED ORDER — MONTELUKAST SODIUM 10 MG PO TABS: 10.0000 mg | ORAL_TABLET | Freq: Every day | ORAL | Status: DC

## 2012-10-21 ENCOUNTER — Other Ambulatory Visit (INDEPENDENT_AMBULATORY_CARE_PROVIDER_SITE_OTHER): Payer: BC Managed Care – PPO

## 2012-10-21 DIAGNOSIS — Z0000 Encounter for general adult medical examination without abnormal findings: Secondary | ICD-10-CM

## 2012-10-21 LAB — CBC WITH DIFFERENTIAL/PLATELET
Basophils Absolute: 0 10*3/uL (ref 0.0–0.1)
Basophils Relative: 0.4 % (ref 0.0–3.0)
Eosinophils Absolute: 0.2 10*3/uL (ref 0.0–0.7)
HCT: 42.7 % (ref 39.0–52.0)
Hemoglobin: 14.4 g/dL (ref 13.0–17.0)
Lymphs Abs: 1.9 10*3/uL (ref 0.7–4.0)
MCHC: 33.7 g/dL (ref 30.0–36.0)
MCV: 89.1 fl (ref 78.0–100.0)
Monocytes Absolute: 0.5 10*3/uL (ref 0.1–1.0)
Neutro Abs: 3.8 10*3/uL (ref 1.4–7.7)
RDW: 13.1 % (ref 11.5–14.6)

## 2012-10-21 LAB — POCT URINALYSIS DIPSTICK
Leukocytes, UA: NEGATIVE
Protein, UA: NEGATIVE
Urobilinogen, UA: 0.2
pH, UA: 6

## 2012-10-21 LAB — LIPID PANEL
Cholesterol: 159 mg/dL (ref 0–200)
LDL Cholesterol: 98 mg/dL (ref 0–99)

## 2012-10-21 LAB — BASIC METABOLIC PANEL
CO2: 31 mEq/L (ref 19–32)
Calcium: 8.6 mg/dL (ref 8.4–10.5)
Chloride: 103 mEq/L (ref 96–112)
Glucose, Bld: 98 mg/dL (ref 70–99)
Potassium: 4.2 mEq/L (ref 3.5–5.1)
Sodium: 138 mEq/L (ref 135–145)

## 2012-10-21 LAB — TSH: TSH: 1.27 u[IU]/mL (ref 0.35–5.50)

## 2012-10-21 LAB — HEPATIC FUNCTION PANEL
Albumin: 4 g/dL (ref 3.5–5.2)
Total Protein: 6.1 g/dL (ref 6.0–8.3)

## 2012-10-28 ENCOUNTER — Encounter: Payer: Self-pay | Admitting: Family Medicine

## 2012-10-28 ENCOUNTER — Other Ambulatory Visit: Payer: Self-pay | Admitting: *Deleted

## 2012-10-28 ENCOUNTER — Ambulatory Visit (INDEPENDENT_AMBULATORY_CARE_PROVIDER_SITE_OTHER): Payer: BC Managed Care – PPO | Admitting: Family Medicine

## 2012-10-28 VITALS — BP 150/90 | Temp 97.6°F | Ht 71.0 in | Wt 194.0 lb

## 2012-10-28 DIAGNOSIS — Z23 Encounter for immunization: Secondary | ICD-10-CM

## 2012-10-28 DIAGNOSIS — I429 Cardiomyopathy, unspecified: Secondary | ICD-10-CM

## 2012-10-28 DIAGNOSIS — I428 Other cardiomyopathies: Secondary | ICD-10-CM

## 2012-10-28 DIAGNOSIS — J309 Allergic rhinitis, unspecified: Secondary | ICD-10-CM

## 2012-10-28 DIAGNOSIS — J45909 Unspecified asthma, uncomplicated: Secondary | ICD-10-CM

## 2012-10-28 DIAGNOSIS — I4891 Unspecified atrial fibrillation: Secondary | ICD-10-CM

## 2012-10-28 DIAGNOSIS — I10 Essential (primary) hypertension: Secondary | ICD-10-CM

## 2012-10-28 MED ORDER — APIXABAN 5 MG PO TABS: 5.0000 mg | ORAL_TABLET | Freq: Two times a day (BID) | ORAL | Status: DC

## 2012-10-28 MED ORDER — FLUTICASONE PROPIONATE 50 MCG/ACT NA SUSP: 1.0000 | Freq: Every day | NASAL | Status: DC

## 2012-10-28 MED ORDER — METOPROLOL SUCCINATE ER 50 MG PO TB24: 50.0000 mg | ORAL_TABLET | Freq: Every day | ORAL | Status: DC

## 2012-10-28 MED ORDER — LOSARTAN POTASSIUM 100 MG PO TABS: 100.0000 mg | ORAL_TABLET | Freq: Every day | ORAL | Status: DC

## 2012-10-28 MED ORDER — MONTELUKAST SODIUM 10 MG PO TABS: 10.0000 mg | ORAL_TABLET | Freq: Every day | ORAL | Status: DC

## 2012-10-28 NOTE — Progress Notes (Signed)
  Subjective:    Patient ID: Alvin Morrison, male    DOB: 01-10-1956, 57 y.o.   MRN: [id number]  HPI Ithan is a 57 year old married male nonsmoker who comes in today for yearly evaluation because of a history of allergic rhinitis, hypertension, recent A. fib cardioversion April 2014  Since that time she's been on a blood thinner. He was to know how long he should take it since he's been in sinus rhythm for 6 months. We'll defer back to Dr. Marca Ancona  He otherwise feels well.  Is retired he spends a lot of time up in [place]. He and his wife are building a cabin they do a lot of hiking.  He gets routine eye care, dental care, colonoscopy 8 years ago was normal,,,,,,,,,,, vaccinations up-to-date seasonal flu shot given today.   Review of Systems  Constitutional: Negative.   HENT: Negative.   Eyes: Negative.   Respiratory: Negative.   Cardiovascular: Negative.   Gastrointestinal: Negative.   Genitourinary: Negative.   Musculoskeletal: Negative.   Skin: Negative.   Neurological: Negative.   Psychiatric/Behavioral: Negative.        Objective:   Physical Exam  Nursing note and vitals reviewed. Constitutional: He is oriented to person, place, and time. He appears well-developed and well-nourished.  HENT:  Head: Normocephalic and atraumatic.  Right Ear: External ear normal.  Left Ear: External ear normal.  Nose: Nose normal.  Mouth/Throat: Oropharynx is clear and moist.  Eyes: Conjunctivae and EOM are normal. Pupils are equal, round, and reactive to light.  Neck: Normal range of motion. Neck supple. No JVD present. No tracheal deviation present. No thyromegaly present.  Cardiovascular: Normal rate, regular rhythm, normal heart sounds and intact distal pulses.  Exam reveals no gallop and no friction rub.   No murmur heard. Pulmonary/Chest: Effort normal and breath sounds normal. No stridor. No respiratory distress. He has no wheezes. He has no rales. He exhibits no  tenderness.  Abdominal: Soft. Bowel sounds are normal. He exhibits no distension and no mass. There is no tenderness. There is no rebound and no guarding.  Genitourinary: Rectum normal, prostate normal and penis normal. Guaiac negative stool. No penile tenderness.  Musculoskeletal: Normal range of motion. He exhibits no edema and no tenderness.  Lymphadenopathy:    He has no cervical adenopathy.  Neurological: He is alert and oriented to person, place, and time. He has normal reflexes. No cranial nerve deficit. He exhibits normal muscle tone.  Skin: Skin is warm and dry. No rash noted. No erythema. No pallor.  Psychiatric: He has a normal mood and affect. His behavior is normal. Judgment and thought content normal.          Assessment & Plan:  Healthy male  Allergic rhinitis continue Zyrtec steroid nasal spray and Singulair  Hypertension continue Cozaar 100 mg daily BP at home 1:30 to 135 systolic, diastolic 75-80  History of A. fib continue Toprol 50 mg daily call Dr. Lucien Mons about the continuing to blood thinner since she been in sinus rhythm for 6 months  Reflux esophagitis Prilosec OTC 20 mg daily

## 2012-10-28 NOTE — Patient Instructions (Signed)
Continue your current medications  Call Dr. Burnetta Sabin office to find out how long you need to stay on a blood thinner since she been in sinus rhythm for 6 months  Return in one year sooner if any problem

## 2012-12-24 ENCOUNTER — Other Ambulatory Visit (INDEPENDENT_AMBULATORY_CARE_PROVIDER_SITE_OTHER): Payer: BC Managed Care – PPO

## 2012-12-24 DIAGNOSIS — I4891 Unspecified atrial fibrillation: Secondary | ICD-10-CM

## 2012-12-24 LAB — BASIC METABOLIC PANEL
BUN: 21 mg/dL (ref 6–23)
CO2: 28 mEq/L (ref 19–32)
Calcium: 8.8 mg/dL (ref 8.4–10.5)
Chloride: 104 mEq/L (ref 96–112)
Creatinine, Ser: 1.1 mg/dL (ref 0.4–1.5)
GFR: 73.64 mL/min (ref >60.00)
Glucose, Bld: 132 mg/dL — ABNORMAL HIGH (ref 70–99)

## 2012-12-24 LAB — CBC WITH DIFFERENTIAL/PLATELET
Basophils Absolute: 0 10*3/uL (ref 0.0–0.1)
Eosinophils Absolute: 0.2 10*3/uL (ref 0.0–0.7)
HCT: 42.5 % (ref 39.0–52.0)
Lymphocytes Relative: 28.7 % (ref 12.0–46.0)
Lymphs Abs: 1.7 10*3/uL (ref 0.7–4.0)
MCHC: 34.5 g/dL (ref 30.0–36.0)
MCV: 86.7 fl (ref 78.0–100.0)
Monocytes Relative: 7.2 % (ref 3.0–12.0)
Platelets: 156 10*3/uL (ref 150.0–400.0)
RDW: 13 % (ref 11.5–14.6)

## 2013-04-20 ENCOUNTER — Telehealth: Payer: Self-pay | Admitting: Cardiology

## 2013-04-20 NOTE — Telephone Encounter (Signed)
Discussed lab results from December 2014.

## 2013-04-20 NOTE — Telephone Encounter (Signed)
New message  ° ° °Patient calling for test results.   °

## 2013-05-25 ENCOUNTER — Other Ambulatory Visit: Payer: Self-pay | Admitting: Cardiology

## 2013-06-26 ENCOUNTER — Ambulatory Visit (INDEPENDENT_AMBULATORY_CARE_PROVIDER_SITE_OTHER): Payer: BC Managed Care – PPO | Admitting: Cardiology

## 2013-06-26 ENCOUNTER — Encounter: Payer: Self-pay | Admitting: Cardiology

## 2013-06-26 ENCOUNTER — Telehealth: Payer: Self-pay | Admitting: Cardiology

## 2013-06-26 VITALS — BP 151/84 | HR 50 | Ht 72.0 in | Wt 189.8 lb

## 2013-06-26 DIAGNOSIS — I10 Essential (primary) hypertension: Secondary | ICD-10-CM

## 2013-06-26 DIAGNOSIS — I4891 Unspecified atrial fibrillation: Secondary | ICD-10-CM

## 2013-06-26 DIAGNOSIS — I428 Other cardiomyopathies: Secondary | ICD-10-CM

## 2013-06-26 LAB — BASIC METABOLIC PANEL
BUN: 22 mg/dL (ref 6–23)
CO2: 28 mEq/L (ref 19–32)
Calcium: 9.3 mg/dL (ref 8.4–10.5)
Chloride: 103 mEq/L (ref 96–112)
Creatinine, Ser: 1.1 mg/dL (ref 0.4–1.5)
GFR: 70.51 mL/min (ref >60.00)
Glucose, Bld: 96 mg/dL (ref 70–99)
POTASSIUM: 4.4 meq/L (ref 3.5–5.1)
SODIUM: 137 meq/L (ref 135–145)

## 2013-06-26 LAB — CBC WITH DIFFERENTIAL/PLATELET
Basophils Absolute: 0.1 10*3/uL (ref 0.0–0.1)
Basophils Relative: 0.6 % (ref 0.0–3.0)
EOS PCT: 3.7 % (ref 0.0–5.0)
Eosinophils Absolute: 0.3 10*3/uL (ref 0.0–0.7)
HEMATOCRIT: 44.4 % (ref 39.0–52.0)
HEMOGLOBIN: 15.1 g/dL (ref 13.0–17.0)
LYMPHS ABS: 2.1 10*3/uL (ref 0.7–4.0)
Lymphocytes Relative: 26.2 % (ref 12.0–46.0)
MCHC: 33.9 g/dL (ref 30.0–36.0)
MCV: 89 fl (ref 78.0–100.0)
MONOS PCT: 7.5 % (ref 3.0–12.0)
Monocytes Absolute: 0.6 10*3/uL (ref 0.1–1.0)
Neutro Abs: 5.1 10*3/uL (ref 1.4–7.7)
Neutrophils Relative %: 62 % (ref 43.0–77.0)
Platelets: 169 10*3/uL (ref 150.0–400.0)
RBC: 4.99 Mil/uL (ref 4.22–5.81)
RDW: 13.1 % (ref 11.5–15.5)
WBC: 8.2 10*3/uL (ref 4.0–10.5)

## 2013-06-26 MED ORDER — APIXABAN 5 MG PO TABS
ORAL_TABLET | ORAL | Status: DC
Start: 2013-06-26 — End: 2014-05-18

## 2013-06-26 MED ORDER — AMLODIPINE BESYLATE 2.5 MG PO TABS: 2.5000 mg | ORAL_TABLET | Freq: Every day | ORAL | Status: DC

## 2013-06-26 NOTE — Patient Instructions (Signed)
Start amlodipine 2.5mg  daily.   Your physician recommends that you have  lab work today--BMET/CBCd.  Your physician wants you to follow-up in: 1 year with Dr Shirlee Latch. (June 2016).  You will receive a reminder letter in the mail two months in advance. If you don't receive a letter, please call our office to schedule the follow-up appointment.

## 2013-06-26 NOTE — Telephone Encounter (Signed)
Spoke with pt and Casimiro Needle at [facility]. Pt has Eliquis refills for 1 year.

## 2013-06-26 NOTE — Telephone Encounter (Signed)
New Message:  Pt is calling in reference to his elliquis Rx.. States he will give more details when the nurse calls back

## 2013-06-28 NOTE — Progress Notes (Signed)
Patient ID: Alvin Morrison, male   DOB: [birth year], 58 y.o.   MRN: [id number] PCP: Dr. Tawanna Cooler  58 yo with history of HTN and paroxysmal atrial fibrillation returns for followup.  He initially was noted to be in atrial fibrillation in 6/12.  He spontaneously converted to NSR.  CHADSVASC score was 1 so I started him on ASA.   He went back into atrial fibrillation with RVR in 4/14.  He was started on apixaban and had a TEE-guided cardioversion back to NSR.  TEE showed EF 50-55%, which is stable compared to the past.    He remains in NSR today.  No tachypalpitations.  No bleeding problems with apixaban.  No exertional chest pain or dyspnea.  He walks 5-6 miles/day for exercise.  BP has been running high on current regimen.   ECG: NSR, normal  Labs (8/11): LDL 82, HDL 38, K 4.1, creatinine 1.0 Labs (6/12): TSH normal, HCT 44.3 Labs (9/12): K 5.4, creatinine 1.1, LDL 102, HDL 38.3, TSH normal Labs (10/12): K 4.5, creatinine 1.1 Labs (4/14): K 4.6, creatinine 1.1 Labs (10/14): LDL 98 Labs (12/14): K 4.3, creatinine 1.1  PMH:  1. Allergic rhinitis 2. HTN 3. Atrial fibrillation: first noted in 6/12.  Recurrent in 4/14, had TEE-guided DCCV back to NSR.  4. Appendectomy 5. Sinus surgery 6. Cardiomyopathy: Echo (6/12) with EF 45-50%, mild diffuse hypokinesis worse in the septum, mild MR, mild to moderate LAE.  ETT-myoview (7/12): 14'15" exercise, stopped due to dyspnea, no ECG changes, no evidence for ischemia or infarction, EF 56%.  Echo (6/13): EF 50-55%, no regional wall motion abnormalities. TEE (4/14) with EF 50-55%, normal RV size/systolic function, no significant valvular abnormalities.   FH: mother with rheumatic heart disease, CVA.  Son developed atrial fibrillation at age 49 and has had DCCV x 2.   SH: Lives in [place] with wife.  Retired Futures trader.  Nonsmoker.  Drinks 1-2 glasses wine/day on weekdays, 3-5 drinks at day on weekends.    ROS: All systems reviewed and negative except  as per HPI.   Current Outpatient Prescriptions  Medication Sig Dispense Refill  . apixaban (ELIQUIS) 5 MG TABS tablet TAKE 1 TABLET BY MOUTH 2 TIMES A DAY.  180 tablet  3  . calcium citrate-vitamin D (CITRACAL+D) 315-200 MG-UNIT per tablet Take 1 tablet by mouth daily.        . cetirizine (ZYRTEC) 10 MG tablet Take 10 mg by mouth daily.        . fluticasone (FLONASE) 50 MCG/ACT nasal spray Place 1 spray into the nose as needed.      [name] Kitchen losartan (COZAAR) 100 MG tablet Take 1 tablet (100 mg total) by mouth daily.  100 tablet  3  . metoprolol succinate (TOPROL-XL) 50 MG 24 hr tablet Take 1 tablet (50 mg total) by mouth daily. Take with or immediately following a meal.  100 tablet  3  . Multiple Vitamins-Minerals (ONE-A-DAY MENS 50+ ADVANTAGE PO) Take 1 capsule by mouth daily.      [name] Kitchen omeprazole (PRILOSEC OTC) 20 MG tablet Take 20 mg by mouth as needed.       . triamcinolone ointment (KENALOG) 0.1 % Apply topically 2 (two) times daily.  60 g  3  . vitamin C (ASCORBIC ACID) 500 MG tablet Take 500 mg by mouth daily.        [name] Kitchen zinc gluconate 50 MG tablet Take 50 mg by mouth daily.        [name] Kitchen amLODipine (NORVASC)  2.5 MG tablet Take 1 tablet (2.5 mg total) by mouth daily.  90 tablet  3   No current facility-administered medications for this visit.    BP 151/84  Pulse 50  Ht 6' (1.829 m)  Wt 86.093 kg (189 lb 12.8 oz)  BMI 25.74 kg/m2 General: NAD Neck: No JVD, no thyromegaly or thyroid nodule.  Lungs: Clear to auscultation bilaterally with normal respiratory effort. CV: Nondisplaced PMI.  Heart regular S1/S2, no S3/S4, no murmur.  No peripheral edema.  No carotid bruit.  Normal pedal pulses.  Abdomen: Soft, nontender, no hepatosplenomegaly, no distention.  Neurologic: Alert and oriented x 3.  Psych: Normal affect. Extremities: No clubbing or cyanosis.   Assessment/Plan: 1. Atrial fibrillation: Paroxysmal.  DCCV back to NSR in 4/14, remains in NSR today.  As his episodes of symptomatic atrial  fibrillation are rare (1-2 times/year), I have not started him on an anti-arrhythmic medication.  CHADSVASC = 1.  We had a discussion about risk/benefit of apixaban.  I think that the bleeding risk is probably lower than warfarin and not much more than ASA.  He will continue it long-term.  Continue Toprol XL.  I will check BMET and CBC today and every 6 months with apixaban use.  2.HTN: BP is running high.  Patient is allergic to HCTZ.  Therefore, I will start him on amlodipine 2.5 mg daily.  He will check BP daily and call if BP > 140/90 consistently.   Marca AnconaDalton McLean 06/28/2013

## 2013-10-27 ENCOUNTER — Other Ambulatory Visit (INDEPENDENT_AMBULATORY_CARE_PROVIDER_SITE_OTHER): Payer: BC Managed Care – PPO

## 2013-10-27 DIAGNOSIS — Z0000 Encounter for general adult medical examination without abnormal findings: Secondary | ICD-10-CM

## 2013-10-27 LAB — CBC WITH DIFFERENTIAL/PLATELET
BASOS ABS: 0 10*3/uL (ref 0.0–0.1)
Basophils Relative: 0.5 % (ref 0.0–3.0)
EOS ABS: 0.2 10*3/uL (ref 0.0–0.7)
Eosinophils Relative: 3.2 % (ref 0.0–5.0)
HEMATOCRIT: 45 % (ref 39.0–52.0)
HEMOGLOBIN: 15 g/dL (ref 13.0–17.0)
LYMPHS ABS: 2.1 10*3/uL (ref 0.7–4.0)
Lymphocytes Relative: 29.1 % (ref 12.0–46.0)
MCHC: 33.2 g/dL (ref 30.0–36.0)
MCV: 88 fl (ref 78.0–100.0)
MONO ABS: 0.6 10*3/uL (ref 0.1–1.0)
Monocytes Relative: 7.7 % (ref 3.0–12.0)
NEUTROS ABS: 4.3 10*3/uL (ref 1.4–7.7)
Neutrophils Relative %: 59.5 % (ref 43.0–77.0)
Platelets: 158 10*3/uL (ref 150.0–400.0)
RBC: 5.12 Mil/uL (ref 4.22–5.81)
RDW: 13.3 % (ref 11.5–15.5)
WBC: 7.2 10*3/uL (ref 4.0–10.5)

## 2013-10-27 LAB — LIPID PANEL
CHOLESTEROL: 165 mg/dL (ref 0–200)
HDL: 32.2 mg/dL — ABNORMAL LOW (ref >39.00)
LDL CALC: 112 mg/dL — AB (ref 0–99)
NonHDL: 132.8
Total CHOL/HDL Ratio: 5
Triglycerides: 102 mg/dL (ref 0.0–149.0)
VLDL: 20.4 mg/dL (ref 0.0–40.0)

## 2013-10-27 LAB — POCT URINALYSIS DIPSTICK
Bilirubin, UA: NEGATIVE
Blood, UA: NEGATIVE
Glucose, UA: NEGATIVE
Ketones, UA: NEGATIVE
LEUKOCYTES UA: NEGATIVE
Nitrite, UA: NEGATIVE
Spec Grav, UA: 1.02
UROBILINOGEN UA: 0.2
pH, UA: 5.5

## 2013-10-27 LAB — HEPATIC FUNCTION PANEL
ALT: 26 U/L (ref 0–53)
AST: 18 U/L (ref 0–37)
Albumin: 3.5 g/dL (ref 3.5–5.2)
Alkaline Phosphatase: 49 U/L (ref 39–117)
Bilirubin, Direct: 0.1 mg/dL (ref 0.0–0.3)
Total Bilirubin: 0.6 mg/dL (ref 0.2–1.2)
Total Protein: 6.4 g/dL (ref 6.0–8.3)

## 2013-10-27 LAB — BASIC METABOLIC PANEL
BUN: 21 mg/dL (ref 6–23)
CO2: 27 meq/L (ref 19–32)
Calcium: 9.1 mg/dL (ref 8.4–10.5)
Chloride: 103 mEq/L (ref 96–112)
Creatinine, Ser: 1.1 mg/dL (ref 0.4–1.5)
GFR: 73.42 mL/min (ref >60.00)
GLUCOSE: 100 mg/dL — AB (ref 70–99)
Potassium: 4.9 mEq/L (ref 3.5–5.1)
SODIUM: 138 meq/L (ref 135–145)

## 2013-10-27 LAB — PSA: PSA: 2.63 ng/mL (ref 0.10–4.00)

## 2013-10-27 LAB — TSH: TSH: 1.24 u[IU]/mL (ref 0.35–4.50)

## 2013-11-03 ENCOUNTER — Ambulatory Visit (INDEPENDENT_AMBULATORY_CARE_PROVIDER_SITE_OTHER): Payer: BC Managed Care – PPO | Admitting: Family Medicine

## 2013-11-03 ENCOUNTER — Encounter: Payer: Self-pay | Admitting: Family Medicine

## 2013-11-03 VITALS — BP 140/80 | Temp 97.4°F | Ht 71.0 in | Wt 197.0 lb

## 2013-11-03 DIAGNOSIS — I480 Paroxysmal atrial fibrillation: Secondary | ICD-10-CM

## 2013-11-03 DIAGNOSIS — K2100 Gastro-esophageal reflux disease with esophagitis, without bleeding: Secondary | ICD-10-CM

## 2013-11-03 DIAGNOSIS — I4891 Unspecified atrial fibrillation: Secondary | ICD-10-CM

## 2013-11-03 DIAGNOSIS — Z23 Encounter for immunization: Secondary | ICD-10-CM

## 2013-11-03 DIAGNOSIS — I429 Cardiomyopathy, unspecified: Secondary | ICD-10-CM

## 2013-11-03 DIAGNOSIS — I10 Essential (primary) hypertension: Secondary | ICD-10-CM

## 2013-11-03 DIAGNOSIS — Z0000 Encounter for general adult medical examination without abnormal findings: Secondary | ICD-10-CM

## 2013-11-03 DIAGNOSIS — J301 Allergic rhinitis due to pollen: Secondary | ICD-10-CM

## 2013-11-03 MED ORDER — LOSARTAN POTASSIUM 100 MG PO TABS: 100.0000 mg | ORAL_TABLET | Freq: Every day | ORAL | Status: DC

## 2013-11-03 MED ORDER — FLUTICASONE PROPIONATE 50 MCG/ACT NA SUSP: 1.0000 | NASAL | Status: DC | PRN

## 2013-11-03 MED ORDER — MONTELUKAST SODIUM 10 MG PO TABS: 10.0000 mg | ORAL_TABLET | Freq: Every day | ORAL | Status: DC

## 2013-11-03 MED ORDER — OMEPRAZOLE MAGNESIUM 20 MG PO TBEC: 20.0000 mg | DELAYED_RELEASE_TABLET | ORAL | Status: DC | PRN

## 2013-11-03 MED ORDER — METOPROLOL SUCCINATE ER 50 MG PO TB24: 50.0000 mg | ORAL_TABLET | Freq: Every day | ORAL | Status: DC

## 2013-11-03 NOTE — Progress Notes (Signed)
   Subjective:    Patient ID: Alvin Morrison, male    DOB: [birth year], 58 y.o.   MRN: [id number]  HPI Alvin Morrison is a 58 year old married male nonsmoker,,,,,,,,, retired Archivist,,,,,,,, who comes in today for general physical examination because of a history of hypertension, allergic rhinitis, reflux esophagitis, atrial fibrillation,  He says he feels well has no complaints. He saw Dr. Shirlee Latch in June who added Norvasc 2.5 mg to his treatment regime. BP 140/80  He gets routine eye care, dental care, colonoscopy and GI, vaccinations updated by [facility]   Review of Systems  Constitutional: Negative.   HENT: Negative.   Eyes: Negative.   Respiratory: Negative.   Cardiovascular: Negative.   Gastrointestinal: Negative.   Endocrine: Negative.   Genitourinary: Negative.   Musculoskeletal: Negative.   Skin: Negative.   Allergic/Immunologic: Negative.   Neurological: Negative.   Hematological: Negative.   Psychiatric/Behavioral: Negative.        Objective:   Physical Exam  Nursing note and vitals reviewed. Constitutional: He is oriented to person, place, and time. He appears well-developed and well-nourished.  HENT:  Head: Normocephalic and atraumatic.  Right Ear: External ear normal.  Left Ear: External ear normal.  Nose: Nose normal.  Mouth/Throat: Oropharynx is clear and moist.  Eyes: Conjunctivae and EOM are normal. Pupils are equal, round, and reactive to light.  Neck: Normal range of motion. Neck supple. No JVD present. No tracheal deviation present. No thyromegaly present.  Cardiovascular: Normal rate, regular rhythm, normal heart sounds and intact distal pulses.  Exam reveals no gallop and no friction rub.   No murmur heard. No carotid neuritic bruits peripheral pulses 2+ and symmetrical  Regular sinus rhythm  Pulmonary/Chest: Effort normal and breath sounds normal. No stridor. No respiratory distress. He has no wheezes. He has no rales. He exhibits no tenderness.    Abdominal: Soft. Bowel sounds are normal. He exhibits no distension and no mass. There is no tenderness. There is no rebound and no guarding.  Genitourinary: Rectum normal, prostate normal and penis normal. Guaiac negative stool. No penile tenderness.  Musculoskeletal: Normal range of motion. He exhibits no edema and no tenderness.  Lymphadenopathy:    He has no cervical adenopathy.  Neurological: He is alert and oriented to person, place, and time. He has normal reflexes. No cranial nerve deficit. He exhibits normal muscle tone.  Skin: Skin is warm and dry. No rash noted. No erythema. No pallor.  Psychiatric: He has a normal mood and affect. His behavior is normal. Judgment and thought content normal.          Assessment & Plan:  Healthy male  Hypertension at goal BP 140/80 continue current medication  History of atrial fib currently asymptomatic sinus rhythm  Allergic rhinitis continue steroid nasal spray and Zyrtec plain  Reflux esophagitis omeprazole daily when necessary

## 2013-11-03 NOTE — Progress Notes (Signed)
Pre visit review using our clinic review tool, if applicable. No additional management support is needed unless otherwise documented below in the visit note. 

## 2013-11-03 NOTE — Patient Instructions (Signed)
Continue current medications  Followup in 1 year sooner if any problems 

## 2013-11-04 ENCOUNTER — Telehealth: Payer: Self-pay | Admitting: Family Medicine

## 2013-11-04 NOTE — Telephone Encounter (Signed)
emmi emailed °

## 2014-05-18 ENCOUNTER — Other Ambulatory Visit: Payer: Self-pay | Admitting: Cardiology

## 2014-05-26 ENCOUNTER — Other Ambulatory Visit (HOSPITAL_COMMUNITY): Payer: Self-pay | Admitting: *Deleted

## 2014-05-26 ENCOUNTER — Ambulatory Visit (HOSPITAL_COMMUNITY)
Admission: RE | Admit: 2014-05-26 | Discharge: 2014-05-26 | Disposition: A | Discharge status: Home / Self Care | Payer: BC Managed Care – PPO | Source: Ambulatory Visit | Attending: Nurse Practitioner | Admitting: Nurse Practitioner

## 2014-05-26 ENCOUNTER — Encounter (HOSPITAL_COMMUNITY): Payer: Self-pay | Admitting: Nurse Practitioner

## 2014-05-26 ENCOUNTER — Telehealth: Payer: Self-pay | Admitting: Nurse Practitioner

## 2014-05-26 VITALS — BP 112/82 | HR 116 | Ht 72.0 in | Wt 192.2 lb

## 2014-05-26 DIAGNOSIS — I480 Paroxysmal atrial fibrillation: Secondary | ICD-10-CM | POA: Insufficient documentation

## 2014-05-26 MED ORDER — FLECAINIDE ACETATE 100 MG PO TABS: ORAL_TABLET | ORAL | Status: DC

## 2014-05-26 NOTE — Patient Instructions (Signed)
Your physician has recommended you make the following change in your medication:  1)Flecainide 300mg  one time tonight  Come back for EKG in the morning to see what heart rate is doing after this dose.

## 2014-05-26 NOTE — Telephone Encounter (Signed)
Calling stating he has been in Afib since last PM. HR has been 85-98 but heart is racing.  Is slightly SOB, dizzy and anxious.  BP 145/90; 140/81. 135/81. States he is taking all of his medicines as prescribed.  States  he had a cardioversion in April 2014. Spoke w/Stacy w/ Rudi Coco NP and they can see him today at 2:[facility] with entrance code of [id number].  He will accept appointment and be there at 2:00.

## 2014-05-26 NOTE — Telephone Encounter (Signed)
New Message   Patient feels like that his heart is in A-Fib, heart rate is fast and SOB.

## 2014-05-27 ENCOUNTER — Ambulatory Visit (HOSPITAL_COMMUNITY)
Admission: RE | Admit: 2014-05-27 | Discharge: 2014-05-27 | Disposition: A | Discharge status: Home / Self Care | Payer: BC Managed Care – PPO | Source: Ambulatory Visit | Attending: Nurse Practitioner | Admitting: Nurse Practitioner

## 2014-05-27 ENCOUNTER — Encounter (HOSPITAL_COMMUNITY): Payer: Self-pay | Admitting: Nurse Practitioner

## 2014-05-27 DIAGNOSIS — I4891 Unspecified atrial fibrillation: Secondary | ICD-10-CM | POA: Insufficient documentation

## 2014-05-27 NOTE — Progress Notes (Signed)
Patient ID: Alvin Morrison, male   DOB: 24-May-1955, 59 y.o.   MRN: [id number]     Primary Care Physician: Evette Georges, MD Cardiologist: Dr. Marianne Sofia Alvin Morrison is a 59 y.o. male with a h/o PAF s/p DCCV in 2014 and has been maintaining SR on metoprolol and takes Eliquis for a chadsvasc score of 1. He was in his usual state of health when he developed afib Tuesday pm and reports today to the afib clinic for further evaluation. Is symptomatic with mild fatigue and dyspnea. Was able to do his usual am walk but it took more effort. He did go out of town last week and forgot his Eliquis x 3 days but has been back on it x 5 days. No strong trigger factor identified for afib, but rarely drinks alcohol and did drink 2 beers last night and the afib occurred shortly after. He denies h/o CAD. Normal EF by echo 2014.  Today, he denies symptoms of chest pain,  orthopnea, PND, lower extremity edema, dizziness, presyncope, syncope, or neurologic sequela. Positive for mild dyspnea and fatigue. The patient is tolerating medications without difficulties and is otherwise without complaint today.   Past Medical History  Diagnosis Date  . AR (allergic rhinitis)   . HTN (hypertension)   . Eczema   . Fracture of left humerus, medial epicondyle      med. epi. not specified   . Dysrhythmia     afib  . GERD (gastroesophageal reflux disease)    Past Surgical History  Procedure Laterality Date  . Colonoscopy  01/26/05  . Left arm sx      humerus sx plate and screws  . Tee without cardioversion N/A 04/29/2012    Procedure: TRANSESOPHAGEAL ECHOCARDIOGRAM (TEE);  Surgeon: Laurey Morale, MD;  Location: [facility] ENDOSCOPY;  Service: Cardiovascular;  Laterality: N/A;  . Cardioversion N/A 04/29/2012    Procedure: CARDIOVERSION;  Surgeon: Laurey Morale, MD;  Location: [facility] ENDOSCOPY;  Service: Cardiovascular;  Laterality: N/A;    Current Outpatient Prescriptions  Medication Sig Dispense Refill  . amLODipine  (NORVASC) 2.5 MG tablet Take 1 tablet (2.5 mg total) by mouth daily. 90 tablet 3  . calcium citrate-vitamin D (CITRACAL+D) 315-200 MG-UNIT per tablet Take 1 tablet by mouth daily.      . cetirizine (ZYRTEC) 10 MG tablet Take 10 mg by mouth daily.      [name] Kitchen ELIQUIS 5 MG TABS tablet TAKE 1 TABLET BY MOUTH 2 TIMES A DAY. 180 tablet 0  . fluticasone (FLONASE) 50 MCG/ACT nasal spray Place 1 spray into both nostrils as needed. 16 g 11  . losartan (COZAAR) 100 MG tablet Take 1 tablet (100 mg total) by mouth daily. 100 tablet 3  . metoprolol succinate (TOPROL-XL) 50 MG 24 hr tablet Take 1 tablet (50 mg total) by mouth daily. Take with or immediately following a meal. 100 tablet 3  . montelukast (SINGULAIR) 10 MG tablet Take 1 tablet (10 mg total) by mouth at bedtime. 100 tablet 3  . Multiple Vitamins-Minerals (ONE-A-DAY MENS 50+ ADVANTAGE PO) Take 1 capsule by mouth daily.    [name] Kitchen omeprazole (PRILOSEC OTC) 20 MG tablet Take 1 tablet (20 mg total) by mouth as needed. 100 tablet 3  . vitamin C (ASCORBIC ACID) 500 MG tablet Take 500 mg by mouth daily.      [name] Kitchen zinc gluconate 50 MG tablet Take 50 mg by mouth daily.      . flecainide (TAMBOCOR) 100 MG tablet Take   (3-100mg  tablets) once 3 tablet 1   No current facility-administered medications for this encounter.    Allergies  Allergen Reactions  . Hydrochlorothiazide Hives and Itching    History   Social History  . Marital Status: Married    Spouse Name: N/A  . Number of Children: N/A  . Years of Education: N/A   Occupational History  . Not on file.   Social History Main Topics  . Smoking status: Never Smoker   . Smokeless tobacco: Never Used  . Alcohol Use: Yes     Comment: occasional  . Drug Use: No  . Sexual Activity: Yes   Other Topics Concern  . Not on file   Social History Narrative   Married; retired; single; gets regular exercise.    No family history on file.  ROS- All systems are reviewed and negative except as per the  HPI above  Physical Exam: Filed Vitals:   05/26/14 1403  BP: 112/82  Pulse: 116  Height: 6' (1.829 m)  Weight: 192 lb 3.2 oz (87.181 kg)    GEN- The patient is well appearing, alert and oriented x 3 today.   Head- normocephalic, atraumatic Eyes-  Sclera clear, conjunctiva pink Ears- hearing intact Oropharynx- clear Neck- supple, no JVP Lymph- no cervical lymphadenopathy Lungs- Clear to ausculation bilaterally, normal work of breathing Heart- Irregular rate and rhythm, no murmurs, rubs or gallops, PMI not laterally displaced GI- soft, NT, ND, + BS Extremities- no clubbing, cyanosis, or edema MS- no significant deformity or atrophy Skin- no rash or lesion Psych- euthymic mood, full affect Neuro- strength and sensation are intact  EKG-Afib at 116 bpm. QRS 86 ms, QTc .  TEE - 04/29/12-Left ventricle: The cavity size was normal. Wall thickness was normal. Systolic function was low normal to mildly decreased. The estimated ejection fraction was in the range of 50% to 55%. Wall motion was normal; there were no regional wall motion abnormalities. - Aortic valve: There was no stenosis. Trivial regurgitation. - Aorta: Normal caliber, minimal plaque. - Mitral valve: Trivial regurgitation. - Left atrium: The atrium was mildly to moderately dilated. No evidence of thrombus in the atrial cavity or appendage. - Right ventricle: The cavity size was normal. - Right atrium: No evidence of thrombus in the atrial cavity or appendage. - Atrial septum: No defect or patent foramen ovale was identified. Echo contrast study showed no right-to-left atrial level shunt, at baseline or with provocation. - Tricuspid valve: Peak RV-RA gradient: 14mm Hg (S).  10/27/13  Ref Range 74mo ago    Sodium 135 - 145 mEq/L 138   Potassium 3.5 - 5.1 mEq/L 4.9   Chloride 96 - 112 mEq/L 103   CO2 19 - 32 mEq/L 27   Glucose, Bld 70 - 99 mg/dL 161 (H)   BUN 6 - 23 mg/dL 21     Creatinine, Ser 0.4 - 1.5 mg/dL 1.1   Calcium 8.4 - 09.6 mg/dL 9.1   GFR >04.54 mL/min 73.42         Assessment and Plan:  1. Symptomatic PAF Discussed with Dr. Johney Frame who reviewed chart and EKG and suggested Flecainide 300 mg x 1 to be taken at home today. He will return in am for EKG and if does not convert, then DCCV. He will continue metoprolol and Eliquis. Will give one extra dose of 300 mg to be used in the future if needed, but he is to call the office first. He knows not to take the 2nd  dose within a week of the first dose. If afib continues to break through often, then consider flecainide 50 mg bid.  F/u with Dr. Shirlee Latch.

## 2014-05-27 NOTE — Progress Notes (Addendum)
Patient ID: Alvin Morrison, male   DOB: 01-Jan-1956, 59 y.o.   MRN: [id number] Patient in for EKG following taking 300mg  of flecainide on 05/26/14 to convert out of AFib.  PatientBP 120/80; HR 60. Ekg showing NSR  Pt in SR this am after taking 300 mg Flecainide last PM. Felt better before bedtime. EKG shows NSR, normal EKG.. Has one additional  300 mg dose to use if needed, but to call office first if needed. No extra flecainide use within 5-7 days. If afib burden persists would recommend 50 mg bid. He will continue metoprolol and eliquis as before. F/u with Dr. Shirlee Latch in June as scheduled.

## 2014-06-05 ENCOUNTER — Other Ambulatory Visit: Payer: Self-pay | Admitting: Cardiovascular Disease

## 2014-06-05 ENCOUNTER — Telehealth: Payer: Self-pay | Admitting: Cardiovascular Disease

## 2014-06-05 MED ORDER — FLECAINIDE ACETATE 50 MG PO TABS: 50.0000 mg | ORAL_TABLET | Freq: Two times a day (BID) | ORAL | Status: DC

## 2014-06-05 NOTE — Telephone Encounter (Signed)
Phone call from Mr. Bartlette. He has recurrent atrial fibrillation. I have reviewed the notes from the A. fib clinic. Instructions were to start flecainide 50 mg twice a day if he has breakthrough atrial fibrillation. We will have him go ahead and start flecainide 50 mg twice a day. He was instructed to continue taking the Eliquis and the metoprolol. He has been instructed to call the A. fib clinic on Monday for further instructions.     Nahser, Deloris Ping, MD  06/05/2014 9:52 AM    [facility] [address] [place],  [address] [place], [place]  [address] Pager [phone] Phone: [phone]; Fax: [phone]   [facility]  [address] [place], [place]  [address] [phone]    Fax [phone]

## 2014-06-07 ENCOUNTER — Other Ambulatory Visit (HOSPITAL_COMMUNITY): Payer: Self-pay | Admitting: *Deleted

## 2014-06-07 MED ORDER — FLECAINIDE ACETATE 50 MG PO TABS: 50.0000 mg | ORAL_TABLET | Freq: Two times a day (BID) | ORAL | Status: DC

## 2014-06-07 NOTE — Telephone Encounter (Signed)
Talked with patient - prescription for 50bid was not called into pharmacy so he filled the 300mg  dose. He took one tab Saturday and took it bid on Sunday and this converted his heart rate back in rhythm.  Discussed with Rudi Coco, NP and will call in prescription for flecainide 50 BID but told patient we may have to increase dose to 100mg  BID since this is what converted him over the weekend.  He has an appointment scheduled for follow up on Thursday to see how he is tolerating daily flecainide. Prescription was called into pharmacy.

## 2014-06-10 ENCOUNTER — Encounter (HOSPITAL_COMMUNITY): Payer: Self-pay | Admitting: Nurse Practitioner

## 2014-06-10 ENCOUNTER — Ambulatory Visit (HOSPITAL_COMMUNITY)
Admission: RE | Admit: 2014-06-10 | Discharge: 2014-06-10 | Disposition: A | Discharge status: Home / Self Care | Payer: BC Managed Care – PPO | Source: Ambulatory Visit | Attending: Nurse Practitioner | Admitting: Nurse Practitioner

## 2014-06-10 VITALS — BP 140/90 | HR 61 | Ht 72.0 in | Wt 193.0 lb

## 2014-06-10 DIAGNOSIS — I480 Paroxysmal atrial fibrillation: Secondary | ICD-10-CM | POA: Diagnosis not present

## 2014-06-10 DIAGNOSIS — I4891 Unspecified atrial fibrillation: Secondary | ICD-10-CM | POA: Diagnosis present

## 2014-06-10 NOTE — Progress Notes (Addendum)
Patient ID: Alvin Morrison, male   DOB: Dec 30, 1955, 59 y.o.   MRN: [id number]     Primary Care Physician: Evette Georges, MD Referring Physician: Triage nurse [facility]   Alvin Morrison is a 59 y.o. male with a h/o PAF that was seen in the afib clinic a 5/11with breakthrough afib. He was given flecainide 300 mg 1x dose which converted pt. He then developed afib again and spoke to the on call MD and was instructed to take 50 mg bid. He has been maintaining SR on this dose. He feels well and is tolerating flecainide without side effects.   Today, he denies symptoms of palpitations, chest pain, shortness of breath, orthopnea, PND, lower extremity edema, dizziness, presyncope, syncope, or neurologic sequela. The patient is tolerating medications without difficulties and is otherwise without complaint today.   Past Medical History  Diagnosis Date  . AR (allergic rhinitis)   . HTN (hypertension)   . Eczema   . Fracture of left humerus, medial epicondyle      med. epi. not specified   . Dysrhythmia     afib  . GERD (gastroesophageal reflux disease)    Past Surgical History  Procedure Laterality Date  . Colonoscopy  01/26/05  . Left arm sx      humerus sx plate and screws  . Tee without cardioversion N/A 04/29/2012    Procedure: TRANSESOPHAGEAL ECHOCARDIOGRAM (TEE);  Surgeon: Laurey Morale, MD;  Location: [facility] ENDOSCOPY;  Service: Cardiovascular;  Laterality: N/A;  . Cardioversion N/A 04/29/2012    Procedure: CARDIOVERSION;  Surgeon: Laurey Morale, MD;  Location: [facility] ENDOSCOPY;  Service: Cardiovascular;  Laterality: N/A;    Current Outpatient Prescriptions  Medication Sig Dispense Refill  . amLODipine (NORVASC) 2.5 MG tablet Take 1 tablet (2.5 mg total) by mouth daily. 90 tablet 3  . calcium citrate-vitamin D (CITRACAL+D) 315-200 MG-UNIT per tablet Take 1 tablet by mouth daily.      . cetirizine (ZYRTEC) 10 MG tablet Take 10 mg by mouth daily.      [name] Kitchen ELIQUIS 5 MG TABS tablet TAKE 1  TABLET BY MOUTH 2 TIMES A DAY. 180 tablet 0  . flecainide (TAMBOCOR) 50 MG tablet Take 1 tablet (50 mg total) by mouth 2 (two) times daily. 60 tablet 6  . fluticasone (FLONASE) 50 MCG/ACT nasal spray Place 1 spray into both nostrils as needed. 16 g 11  . losartan (COZAAR) 100 MG tablet Take 1 tablet (100 mg total) by mouth daily. 100 tablet 3  . metoprolol succinate (TOPROL-XL) 50 MG 24 hr tablet Take 1 tablet (50 mg total) by mouth daily. Take with or immediately following a meal. 100 tablet 3  . montelukast (SINGULAIR) 10 MG tablet Take 1 tablet (10 mg total) by mouth at bedtime. 100 tablet 3  . Multiple Vitamins-Minerals (ONE-A-DAY MENS 50+ ADVANTAGE PO) Take 1 capsule by mouth daily.    [name] Kitchen omeprazole (PRILOSEC OTC) 20 MG tablet Take 1 tablet (20 mg total) by mouth as needed. 100 tablet 3  . vitamin C (ASCORBIC ACID) 500 MG tablet Take 500 mg by mouth daily.      [name] Kitchen zinc gluconate 50 MG tablet Take 50 mg by mouth daily.       No current facility-administered medications for this encounter.    Allergies  Allergen Reactions  . Hydrochlorothiazide Hives and Itching    History   Social History  . Marital Status: Married    Spouse Name: N/A  . Number of Children: N/A  .  Years of Education: N/A   Occupational History  . Not on file.   Social History Main Topics  . Smoking status: Never Smoker   . Smokeless tobacco: Never Used  . Alcohol Use: Yes     Comment: occasional  . Drug Use: No  . Sexual Activity: Yes   Other Topics Concern  . Not on file   Social History Narrative   Married; retired; single; gets regular exercise.    No family history on file.  ROS- All systems are reviewed and negative except as per the HPI above  Physical Exam: Filed Vitals:   06/10/14 0915  BP: 140/90  Pulse: 61  Height: 6' (1.829 m)  Weight: 193 lb (87.544 kg)    GEN- The patient is well appearing, alert and oriented x 3 today.   Head- normocephalic, atraumatic Eyes-  Sclera clear,  conjunctiva pink Ears- hearing intact Oropharynx- clear Neck- supple, no JVP Lymph- no cervical lymphadenopathy Lungs- Clear to ausculation bilaterally, normal work of breathing Heart- Regular rate and rhythm, no murmurs, rubs or gallops, PMI not laterally displaced GI- soft, NT, ND, + BS Extremities- no clubbing, cyanosis, or edema MS- no significant deformity or atrophy Skin- no rash or lesion Psych- euthymic mood, full affect Neuro- strength and sensation are intact  EKG-Normal sinus rhythm at 61 bpm, normal EKG. Pr int 190 ms, QRS 96 ms, QTc 418 ms.   Echo- TEE- 4/2014Left ventricle: The cavity size was normal. Wall thickness was normal. Systolic function was low normal to mildly decreased. The estimated ejection fraction was in the range of 50% to 55%. Wall motion was normal; there were no regional wall motion abnormalities. - Aortic valve: There was no stenosis. Trivial regurgitation. - Aorta: Normal caliber, minimal plaque. - Mitral valve: Trivial regurgitation. - Left atrium: The atrium was mildly to moderately dilated. No evidence of thrombus in the atrial cavity or appendage. - Right ventricle: The cavity size was normal. - Right atrium: No evidence of thrombus in the atrial cavity or appendage. - Atrial septum: No defect or patent foramen ovale was identified. Echo contrast study showed no right-to-left atrial level shunt, at baseline or with provocation. - Tricuspid valve: Peak RV-RA gradient: 14mm Hg (S).   Basic metabolic panel  Status: Finalresult Visible to patient:  Not Released Nextappt: 08/30/2014 at 11:00 AM in Cardiology Hillis Range, MD) Dx:  Routine general medical examination a...        Ref Range 62mo ago (10/27/13) 30mo ago (06/26/13) 64yr ago (12/24/12) 12yr ago (10/21/12)    Sodium 135 - 145 mEq/L 138 137 139 138    Potassium 3.5 - 5.1 mEq/L 4.9 4.4 4.3 4.2    Chloride 96 - 112 mEq/L 103 103 104 103     CO2 19 - 32 mEq/L Glucose, Bld 70 - 99 mg/dL 161 (H) 96 096 (H) 98    BUN 6 - 23 mg/dL Creatinine, Ser 0.4 - 1.5 mg/dL 1.1 1.1 1.1 0.9    Calcium 8.4 - 10.5 mg/dL 9.1 9.3 8.8 8.6    GFR >60.00 mL/min 73.42 60.00 mL/min" class="rz_16" style="cursor: pointer;" onmouseover='jscript: var varStyle="underline"; var bgColor="#[id number]"; this.style.backgroundColor=bgColor; var children=this.getElementsByTagName("div"); for(var child=0;child 70.51 60.00 mL/min" class="rz_16" style="cursor: pointer;" onmouseover='jscript: var varStyle="underline"; var bgColor="#[id number]"; this.style.backgroundColor=bgColor; var children=this.getElementsByTagName("div"); for(var child=0;child 73.64 60.00 mL/min" class="rz_17" style="cursor: pointer;" onmouseover='jscript: var varStyle="underline"; var bgColor="#[id number]"; this.style.backgroundColor=bgColor; var children=this.getElementsByTagName("div"); for(var child=0;child 87.41   Resulting Agency  [place] [facility] [place] [facility] [place] [facility]  [place] [facility]      Specimen Collected: 10/27/13 8:29 AM Last Resulted: 10/27/13 2:03 PM                  Lipid panel  Status: Finalresult Visible to patient:  Not Released Nextappt: 08/30/2014 at 11:00 AM in Cardiology Hillis Range, MD) Dx:  Routine general medical examination a...        Ref Range 29mo ago  76yr ago  23yr ago  4yr ago     Cholesterol 0 - 200 mg/dL 161 [id number]   Comments: ATP III Classification    Desirable: < 200 mg/dL        Borderline High: 200 - 239 mg/dL     High: > = 782 mg/dL    Triglycerides 0.0 - 149.0 mg/dL 956.2 130.0CM 96.0CM 124.0CM   Comments: Normal: <150 mg/dLBorderline High: 150 - 199 mg/dL    HDL >86.57 mg/dL 84.69 (L) 62.95 mg/dL" MWUXL="KG_40" style="cursor: pointer;" onmouseover='jscript: var varStyle="underline"; var bgColor="#[id number]"; this.style.backgroundColor=bgColor; var  children=this.getElementsByTagName("div"); for(var child=0;child 37.40 (L) 39.00 mg/dL" NUUVO="ZD_66" style="cursor: pointer;" onmouseover='jscript: var varStyle="underline"; var bgColor="#D6DFE7"; this.style.backgroundColor=bgColor; var children=this.getElementsByTagName("div"); for(var child=0;child 37.50 (L) 39.00 mg/dL" YQIHK="VQ_25" style="cursor: pointer;" onmouseover='jscript: var varStyle="underline"; var bgColor="#D6DFE7"; this.style.backgroundColor=bgColor; var children=this.getElementsByTagName("div"); for(var child=0;child 38.30 (L)    VLDL 0.0 - 40.0 mg/dL 95.6 38.7 56.4 33.2    LDL Cholesterol 0 - 99 mg/dL 951 (H) 98 89 884 (H)    Total CHOL/HDL Ratio  5 4CM 4CM 4CM   Comments:        Men     Women1/2 Average Risk   3.4     3.3Average Risk     5.0     4.42X Average Risk     9.6     7.13X Average Risk     15.0     11.0              NonHDL  132.80      Comments: NOTE: Non-HDL goal should be 30 mg/dL higher than patient's LDL goal (i.e. LDL goal of < 70 mg/dL, would have non-HDL goal of < 100 mg/dL)   Resulting Agency  [place] [facility] [place] [facility] [place] [facility] [place] [facility]      Specimen Collected: 10/27/13 8:29 AM Last Resulted: 10/27/13 2:03 PM             CM=Additional comments         Assessment and Plan:  1. PAF Maintaining SR on flecainide 50 mg bid. Continue metoprolol succinate 50 mg qd Schedule for GXT on flecainide. Chadsvasc of 1- continue eliquis   2. Htn  Stable  No changes   F/u with Dr. Johney Frame in August Afib clinic as needed

## 2014-06-21 ENCOUNTER — Ambulatory Visit (HOSPITAL_COMMUNITY)
Admission: RE | Admit: 2014-06-21 | Discharge: 2014-06-21 | Disposition: A | Discharge status: Home / Self Care | Payer: BC Managed Care – PPO | Source: Ambulatory Visit | Attending: Nurse Practitioner | Admitting: Nurse Practitioner

## 2014-06-21 DIAGNOSIS — I480 Paroxysmal atrial fibrillation: Secondary | ICD-10-CM | POA: Diagnosis present

## 2014-06-24 LAB — EXERCISE TOLERANCE TEST
CHL CUP MPHR: 160 {beats}/min
CHL CUP STRESS STAGE 1 HR: 57 {beats}/min
CHL CUP STRESS STAGE 1 SBP: 145 mmHg
CHL CUP STRESS STAGE 1 SPEED: 0 mph
CHL CUP STRESS STAGE 2 GRADE: 0 %
CHL CUP STRESS STAGE 2 SPEED: 0.3 mph
CHL CUP STRESS STAGE 3 HR: 55 {beats}/min
CHL CUP STRESS STAGE 4 GRADE: 10 %
CHL CUP STRESS STAGE 6 GRADE: 14 %
CHL CUP STRESS STAGE 6 SPEED: 3.4 mph
CHL CUP STRESS STAGE 7 HR: 67 {beats}/min
CHL CUP STRESS STAGE 7 SBP: 167 mmHg
CHL CUP STRESS STAGE 8 SBP: 140 mmHg
CHL RATE OF PERCEIVED EXERTION: 10
CSEPEW: 10.1 METS
CSEPPHR: 100 {beats}/min
Exercise duration (min): 8 min
Exercise duration (sec): 0 s
Percent HR: 62 %
Percent of predicted max HR: 62 %
Rest HR: 57 {beats}/min
Stage 1 DBP: 93 mmHg
Stage 1 Grade: 0 %
Stage 2 HR: 55 {beats}/min
Stage 3 Grade: 0 %
Stage 3 Speed: 0.3 mph
Stage 4 DBP: 80 mmHg
Stage 4 HR: 77 {beats}/min
Stage 4 SBP: 147 mmHg
Stage 4 Speed: 1.7 mph
Stage 5 DBP: 78 mmHg
Stage 5 Grade: 12 %
Stage 5 HR: 87 {beats}/min
Stage 5 SBP: 161 mmHg
Stage 5 Speed: 2.5 mph
Stage 6 HR: 100 {beats}/min
Stage 7 DBP: 84 mmHg
Stage 7 Grade: 0 %
Stage 7 Speed: 0 mph
Stage 8 DBP: 88 mmHg
Stage 8 Grade: 0 %
Stage 8 HR: 57 {beats}/min
Stage 8 Speed: 0 mph

## 2014-07-07 ENCOUNTER — Ambulatory Visit (HOSPITAL_COMMUNITY): Payer: BC Managed Care – PPO | Admitting: Nurse Practitioner

## 2014-07-07 ENCOUNTER — Ambulatory Visit: Payer: BC Managed Care – PPO | Admitting: Nurse Practitioner

## 2014-08-30 ENCOUNTER — Encounter: Payer: Self-pay | Admitting: Internal Medicine

## 2014-08-30 ENCOUNTER — Ambulatory Visit (INDEPENDENT_AMBULATORY_CARE_PROVIDER_SITE_OTHER): Payer: BC Managed Care – PPO | Admitting: Internal Medicine

## 2014-08-30 VITALS — BP 138/78 | HR 53 | Ht 72.0 in | Wt 194.2 lb

## 2014-08-30 DIAGNOSIS — I480 Paroxysmal atrial fibrillation: Secondary | ICD-10-CM | POA: Diagnosis not present

## 2014-08-30 DIAGNOSIS — I10 Essential (primary) hypertension: Secondary | ICD-10-CM | POA: Diagnosis not present

## 2014-08-30 DIAGNOSIS — I4891 Unspecified atrial fibrillation: Secondary | ICD-10-CM | POA: Diagnosis not present

## 2014-08-30 MED ORDER — LOSARTAN POTASSIUM 100 MG PO TABS: 100.0000 mg | ORAL_TABLET | Freq: Every day | ORAL | Status: DC

## 2014-08-30 MED ORDER — FLECAINIDE ACETATE 50 MG PO TABS: 50.0000 mg | ORAL_TABLET | Freq: Two times a day (BID) | ORAL | Status: DC

## 2014-08-30 MED ORDER — METOPROLOL SUCCINATE ER 50 MG PO TB24: 50.0000 mg | ORAL_TABLET | Freq: Every day | ORAL | Status: DC

## 2014-08-30 MED ORDER — APIXABAN 5 MG PO TABS: 5.0000 mg | ORAL_TABLET | Freq: Two times a day (BID) | ORAL | Status: DC

## 2014-08-30 NOTE — Progress Notes (Signed)
Electrophysiology Office Note   Date:  08/30/2014   ID:  Alvin Morrison, DOB 1955-01-16, MRN [id number]  PCP:  Alvin Georges, MD  Cardiologist:  Dr Alvin Morrison Primary Electrophysiologist: Alvin Range, MD    Chief Complaint  Patient presents with  . PAF     History of Present Illness: [name] is a 59 y.o. male who presents today for electrophysiology evaluation.   Afib is well controlled with flecainide 50mg  BID He is tolerating eliquis without bleeding.  Remains active.  Today, he denies symptoms of palpitations, chest pain, shortness of breath, orthopnea, PND, lower extremity edema, claudication, dizziness, presyncope, syncope, bleeding, or neurologic sequela. The patient is tolerating medications without difficulties and is otherwise without complaint today.    Past Medical History  Diagnosis Date  . AR (allergic rhinitis)   . HTN (hypertension)   . Eczema   . Fracture of left humerus, medial epicondyle      med. epi. not specified   . Paroxysmal atrial fibrillation     chads2vasc score is 1  . GERD (gastroesophageal reflux disease)    Past Surgical History  Procedure Laterality Date  . Colonoscopy  01/26/05  . Left arm sx      humerus sx plate and screws  . Tee without cardioversion N/A 04/29/2012    Procedure: TRANSESOPHAGEAL ECHOCARDIOGRAM (TEE);  Surgeon: Laurey Morale, MD;  Location: [facility] ENDOSCOPY;  Service: Cardiovascular;  Laterality: N/A;  . Cardioversion N/A 04/29/2012    Procedure: CARDIOVERSION;  Surgeon: Laurey Morale, MD;  Location: [facility] ENDOSCOPY;  Service: Cardiovascular;  Laterality: N/A;     Current Outpatient Prescriptions  Medication Sig Dispense Refill  . calcium citrate-vitamin D (CITRACAL+D) 315-200 MG-UNIT per tablet Take 1 tablet by mouth daily.      . cetirizine (ZYRTEC) 10 MG tablet Take 10 mg by mouth daily.      [name] Kitchen ELIQUIS 5 MG TABS tablet TAKE 1 TABLET BY MOUTH 2 TIMES A DAY. 180 tablet 0  . flecainide (TAMBOCOR) 50 MG  tablet Take 1 tablet (50 mg total) by mouth 2 (two) times daily. 60 tablet 6  . fluticasone (FLONASE) 50 MCG/ACT nasal spray Place 1 spray into both nostrils daily as needed for allergies or rhinitis.    [name] Kitchen losartan (COZAAR) 100 MG tablet Take 1 tablet (100 mg total) by mouth daily. 100 tablet 3  . metoprolol succinate (TOPROL-XL) 50 MG 24 hr tablet Take 1 tablet (50 mg total) by mouth daily. Take with or immediately following a meal. 100 tablet 3  . montelukast (SINGULAIR) 10 MG tablet Take 1 tablet (10 mg total) by mouth at bedtime. 100 tablet 3  . Multiple Vitamins-Minerals (ONE-A-DAY MENS 50+ ADVANTAGE PO) Take 1 capsule by mouth daily.    [name] Kitchen omeprazole (PRILOSEC) 20 MG capsule Take 20 mg by mouth daily as needed (heartburn or acid reflux).    . vitamin C (ASCORBIC ACID) 500 MG tablet Take 500 mg by mouth daily.      [name] Kitchen zinc gluconate 50 MG tablet Take 50 mg by mouth daily.       No current facility-administered medications for this visit.    Allergies:   Hydrochlorothiazide   Social History:  The patient  reports that he has never smoked. He has never used smokeless tobacco. He reports that he drinks alcohol. He reports that he does not use illicit drugs.   Family History:  The patient's  family history includes Atrial fibrillation in his son.  ROS:  Please see the history of present illness.   All other systems are reviewed and negative.    PHYSICAL EXAM: VS:  BP 138/78 mmHg  Pulse 53  Ht 6' (1.829 m)  Wt 88.089 kg (194 lb 3.2 oz)  BMI 26.33 kg/m2 , BMI Body mass index is 26.33 kg/(m^2). GEN: Well nourished, well developed, in no acute distress HEENT: normal Neck: no JVD, carotid bruits, or masses Cardiac: RRR; no murmurs, rubs, or gallops,no edema  Respiratory:  clear to auscultation bilaterally, normal work of breathing GI: soft, nontender, nondistended, + BS MS: no deformity or atrophy Skin: warm and dry  Neuro:  Strength and sensation are intact Psych: euthymic mood,  full affect  EKG:  EKG is ordered today. The ekg ordered today shows sinus bradycardia 53 bpm, otherwise normal ekg   Recent Labs: 10/27/2013: ALT 26; BUN 21; Creatinine, Ser 1.1; Hemoglobin 15.0; Platelets 158.0; Potassium 4.9; Sodium 138; TSH 1.24    Lipid Panel     Component Value Date/Time   CHOL 165 10/27/2013 0829   TRIG 102.0 10/27/2013 0829   HDL 32.20* 10/27/2013 0829   CHOLHDL 5 10/27/2013 0829   VLDL 20.4 10/27/2013 0829   LDLCALC 112* 10/27/2013 0829     Wt Readings from Last 3 Encounters:  08/30/14 88.089 kg (194 lb 3.2 oz)  06/10/14 87.544 kg (193 lb)  05/26/14 87.181 kg (192 lb 3.2 oz)      Other studies Reviewed: Additional studies/ records that were reviewed today include: AF clinic notes, prior echos  Review of the above records today demonstrates: preserved EF   ASSESSMENT AND PLAN:  1.  Paroxysmal atrial fibrillation Well controlled with flecainide chads2vasc score is 1.  Doing well on eliquis  2. HTN Stable No change required today   Follow-up with Rudi Coco NP in the AF clinic every 3months I will see annually  Current medicines are reviewed at length with the patient today.   The patient does not have concerns regarding his medicines.  The following changes were made today:  none   Signed, Alvin Range, MD  08/30/2014 11:33 AM     [facility] [address] [place] [address] [phone] (office) [phone] (fax)

## 2014-08-30 NOTE — Patient Instructions (Signed)
Medication Instructions:  Your physician recommends that you continue on your current medications as directed. Please refer to the Current Medication list given to you today.   Labwork: None ordered  Testing/Procedures: none ordered    Follow-Up: Your physician recommends that you schedule a follow-up appointment in: 3 months with Rudi Coco, NP and 12 months with Dr Johney Frame    Any Other Special Instructions Will Be Listed Below (If Applicable).

## 2014-11-23 ENCOUNTER — Other Ambulatory Visit (INDEPENDENT_AMBULATORY_CARE_PROVIDER_SITE_OTHER): Payer: BC Managed Care – PPO

## 2014-11-23 DIAGNOSIS — Z0000 Encounter for general adult medical examination without abnormal findings: Secondary | ICD-10-CM

## 2014-11-23 LAB — CBC WITH DIFFERENTIAL/PLATELET
BASOS ABS: 0 10*3/uL (ref 0.0–0.1)
Basophils Relative: 0.3 % (ref 0.0–3.0)
Eosinophils Absolute: 0.2 10*3/uL (ref 0.0–0.7)
Eosinophils Relative: 2.4 % (ref 0.0–5.0)
HCT: 45.1 % (ref 39.0–52.0)
Hemoglobin: 15.2 g/dL (ref 13.0–17.0)
LYMPHS PCT: 24.3 % (ref 12.0–46.0)
Lymphs Abs: 1.9 10*3/uL (ref 0.7–4.0)
MCHC: 33.6 g/dL (ref 30.0–36.0)
MCV: 87.3 fl (ref 78.0–100.0)
Monocytes Absolute: 0.6 10*3/uL (ref 0.1–1.0)
Monocytes Relative: 7.6 % (ref 3.0–12.0)
NEUTROS PCT: 65.4 % (ref 43.0–77.0)
Neutro Abs: 5 10*3/uL (ref 1.4–7.7)
PLATELETS: 172 10*3/uL (ref 150.0–400.0)
RBC: 5.17 Mil/uL (ref 4.22–5.81)
RDW: 13.1 % (ref 11.5–15.5)
WBC: 7.6 10*3/uL (ref 4.0–10.5)

## 2014-11-23 LAB — BASIC METABOLIC PANEL
BUN: 22 mg/dL (ref 6–23)
CALCIUM: 9.3 mg/dL (ref 8.4–10.5)
CO2: 29 mEq/L (ref 19–32)
Chloride: 106 mEq/L (ref 96–112)
Creatinine, Ser: 1.04 mg/dL (ref 0.40–1.50)
GFR: 77.23 mL/min (ref >60.00)
Glucose, Bld: 108 mg/dL — ABNORMAL HIGH (ref 70–99)
POTASSIUM: 5.2 meq/L — AB (ref 3.5–5.1)
SODIUM: 142 meq/L (ref 135–145)

## 2014-11-23 LAB — POCT URINALYSIS DIPSTICK
Bilirubin, UA: NEGATIVE
GLUCOSE UA: NEGATIVE
KETONES UA: NEGATIVE
Leukocytes, UA: NEGATIVE
Nitrite, UA: NEGATIVE
PH UA: 5.5
Protein, UA: NEGATIVE
RBC UA: NEGATIVE
SPEC GRAV UA: 1.025
Urobilinogen, UA: 0.2

## 2014-11-23 LAB — HEPATIC FUNCTION PANEL
ALK PHOS: 56 U/L (ref 39–117)
ALT: 25 U/L (ref 0–53)
AST: 20 U/L (ref 0–37)
Albumin: 4 g/dL (ref 3.5–5.2)
BILIRUBIN DIRECT: 0.1 mg/dL (ref 0.0–0.3)
BILIRUBIN TOTAL: 0.5 mg/dL (ref 0.2–1.2)
TOTAL PROTEIN: 6.2 g/dL (ref 6.0–8.3)

## 2014-11-23 LAB — LIPID PANEL
CHOL/HDL RATIO: 5
Cholesterol: 163 mg/dL (ref 0–200)
HDL: 35.3 mg/dL — AB (ref >39.00)
LDL CALC: 93 mg/dL (ref 0–99)
NONHDL: 127.32
Triglycerides: 170 mg/dL — ABNORMAL HIGH (ref 0.0–149.0)
VLDL: 34 mg/dL (ref 0.0–40.0)

## 2014-11-23 LAB — TSH: TSH: 1.12 u[IU]/mL (ref 0.35–4.50)

## 2014-11-23 LAB — PSA: PSA: 2.82 ng/mL (ref 0.10–4.00)

## 2014-11-30 ENCOUNTER — Ambulatory Visit (HOSPITAL_COMMUNITY)
Admission: RE | Admit: 2014-11-30 | Discharge: 2014-11-30 | Disposition: A | Discharge status: Home / Self Care | Payer: BC Managed Care – PPO | Source: Ambulatory Visit | Attending: Nurse Practitioner | Admitting: Nurse Practitioner

## 2014-11-30 ENCOUNTER — Ambulatory Visit (INDEPENDENT_AMBULATORY_CARE_PROVIDER_SITE_OTHER): Payer: BC Managed Care – PPO | Admitting: Family Medicine

## 2014-11-30 ENCOUNTER — Encounter: Payer: Self-pay | Admitting: Family Medicine

## 2014-11-30 ENCOUNTER — Encounter (HOSPITAL_COMMUNITY): Payer: Self-pay | Admitting: Nurse Practitioner

## 2014-11-30 VITALS — BP 140/80 | Temp 98.2°F | Ht 70.5 in | Wt 195.0 lb

## 2014-11-30 VITALS — BP 134/78 | HR 61 | Ht 72.0 in | Wt 198.4 lb

## 2014-11-30 DIAGNOSIS — I10 Essential (primary) hypertension: Secondary | ICD-10-CM | POA: Diagnosis not present

## 2014-11-30 DIAGNOSIS — R739 Hyperglycemia, unspecified: Secondary | ICD-10-CM

## 2014-11-30 DIAGNOSIS — I480 Paroxysmal atrial fibrillation: Secondary | ICD-10-CM | POA: Insufficient documentation

## 2014-11-30 DIAGNOSIS — J301 Allergic rhinitis due to pollen: Secondary | ICD-10-CM

## 2014-11-30 DIAGNOSIS — Z0000 Encounter for general adult medical examination without abnormal findings: Secondary | ICD-10-CM | POA: Diagnosis not present

## 2014-11-30 DIAGNOSIS — M10062 Idiopathic gout, left knee: Secondary | ICD-10-CM

## 2014-11-30 DIAGNOSIS — M109 Gout, unspecified: Secondary | ICD-10-CM | POA: Insufficient documentation

## 2014-11-30 DIAGNOSIS — I4891 Unspecified atrial fibrillation: Secondary | ICD-10-CM

## 2014-11-30 DIAGNOSIS — Z23 Encounter for immunization: Secondary | ICD-10-CM

## 2014-11-30 DIAGNOSIS — R7309 Other abnormal glucose: Secondary | ICD-10-CM | POA: Diagnosis not present

## 2014-11-30 LAB — URIC ACID: Uric Acid, Serum: 6.7 mg/dL (ref 4.0–7.8)

## 2014-11-30 LAB — HEMOGLOBIN A1C: Hgb A1c MFr Bld: 5.7 % (ref 4.6–6.5)

## 2014-11-30 MED ORDER — FLUTICASONE PROPIONATE 50 MCG/ACT NA SUSP: 1.0000 | Freq: Every day | NASAL | Status: DC | PRN

## 2014-11-30 MED ORDER — LOSARTAN POTASSIUM 100 MG PO TABS: 100.0000 mg | ORAL_TABLET | Freq: Every day | ORAL | Status: DC

## 2014-11-30 MED ORDER — MONTELUKAST SODIUM 10 MG PO TABS: 10.0000 mg | ORAL_TABLET | Freq: Every day | ORAL | Status: DC

## 2014-11-30 MED ORDER — PREDNISONE 20 MG PO TABS
ORAL_TABLET | ORAL | Status: DC
Start: 2014-11-30 — End: 2015-05-18

## 2014-11-30 MED ORDER — METOPROLOL SUCCINATE ER 50 MG PO TB24: 50.0000 mg | ORAL_TABLET | Freq: Every day | ORAL | Status: DC

## 2014-11-30 MED ORDER — OMEPRAZOLE 20 MG PO CPDR: 20.0000 mg | DELAYED_RELEASE_CAPSULE | Freq: Every day | ORAL | Status: DC | PRN

## 2014-11-30 MED ORDER — ALLOPURINOL 100 MG PO TABS: 100.0000 mg | ORAL_TABLET | Freq: Every day | ORAL | Status: DC

## 2014-11-30 NOTE — Progress Notes (Signed)
   Subjective:    Patient ID: Alvin Morrison, male    DOB: 24-Aug-1955, 59 y.o.   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 He sees cardiology on a regular basis  Last summer he had an episode where his left knee became red-hot swollen and then it resolves spontaneously. On Monday of this week he also notices same thing. He states on Saturday he had 3 [place] ice teas and on Sunday had some brandy. No family history of gout.  Med list reviewed and was correct  Vaccinations up-to-date flu shot given  He gets routine eye care, dental care, due for colonoscopy this fall by his gastroenterologist. His first colonoscopy 10 years ago was normal   Review of Systems  Constitutional: Negative.   HENT: Negative.   Eyes: Negative.   Respiratory: Negative.   Cardiovascular: Negative.   Gastrointestinal: Negative.   Endocrine: Negative.   Genitourinary: Negative.   Musculoskeletal: Negative.   Skin: Negative.   Allergic/Immunologic: Negative.   Neurological: Negative.   Hematological: Negative.   Psychiatric/Behavioral: Negative.        Objective:   Physical Exam  Constitutional: He is oriented to person, place, and time. He appears well-developed and well-nourished.  HENT:  Head: Normocephalic and atraumatic.  Right Ear: External ear normal.  Left Ear: External ear normal.  Nose: Nose normal.  Mouth/Throat: Oropharynx is clear and moist.  Eyes: Conjunctivae and EOM are normal. Pupils are equal, round, and reactive to light.  Neck: Normal range of motion. Neck supple. No JVD present. No tracheal deviation present. No thyromegaly present.  Cardiovascular: Normal rate, regular rhythm, normal heart sounds and  intact distal pulses.  Exam reveals no gallop and no friction rub.   No murmur heard. No carotid nor aortic bruits for full pulses 1+ and symmetrical  Sinus rhythm today  Pulmonary/Chest: Effort normal and breath sounds normal. No stridor. No respiratory distress. He has no wheezes. He has no rales. He exhibits no tenderness.  Abdominal: Soft. Bowel sounds are normal. He exhibits no distension and no mass. There is no tenderness. There is no rebound and no guarding.  Genitourinary: Rectum normal, prostate normal and penis normal. Guaiac negative stool. No penile tenderness.  Musculoskeletal: Normal range of motion. He exhibits no edema or tenderness.  Redness and swelling left knee  Lymphadenopathy:    He has no cervical adenopathy.  Neurological: He is alert and oriented to person, place, and time. He has normal reflexes. No cranial nerve deficit. He exhibits normal muscle tone.  Skin: Skin is warm and dry. No rash noted. No erythema. No pallor.  Psychiatric: He has a normal mood and affect. His behavior is normal. Judgment and thought content normal.  Nursing note and vitals reviewed.         Assessment & Plan:  Healthy male  Acute gout......... check uric acid level...[name] KitchenMarland KitchenMarland Kitchen start allopurinol.......[name] Kitchen prednisone to relieve pain and swelling and redness...[name] KitchenMarland KitchenMarland Kitchen decrease alcohol intake  A. Fib......... continue current medicines followed by cardiology  Allergic rhinitis.......... continue current meds  History of reflux esophagitis.....[name] Kitchen continue Prilosec  Hypertension..... Continue Cozaar and Toprol

## 2014-11-30 NOTE — Progress Notes (Signed)
Patient ID: Alvin Morrison, male   DOB: [birth year], 59 y.o.   MRN: [id number]     Primary Care Physician: Evette Georges, MD Referring Physician: Triage nurse [facility]   Alvin Morrison is a 59 y.o. male with a h/o PAF that was seen in the afib clinic a 5/11with breakthrough afib. He was given flecainide 300 mg 1x dose which converted pt. He then developed afib again and spoke to the on call MD and was instructed to take 50 mg bid. He has been maintaining SR on this dose. He however has been having gout episodes which he has not had before flecainide, Has not been evaluated for gout but does see his PCP today for physical and he will mention. I did look up incidence of gout with flecainide, one site reported 5 people out of 3,666  claimed incidence of gout on flecainide.  Today, he denies symptoms of palpitations, chest pain, shortness of breath, orthopnea, PND, lower extremity edema, dizziness, presyncope, syncope, or neurologic sequela. The patient is tolerating medications without difficulties and is otherwise without complaint today.   Past Medical History  Diagnosis Date  . AR (allergic rhinitis)   . HTN (hypertension)   . Eczema   . Fracture of left humerus, medial epicondyle      med. epi. not specified   . Paroxysmal atrial fibrillation (HCC)     chads2vasc score is 1  . GERD (gastroesophageal reflux disease)    Past Surgical History  Procedure Laterality Date  . Colonoscopy  01/26/05  . Left arm sx      humerus sx plate and screws  . Tee without cardioversion N/A 04/29/2012    Procedure: TRANSESOPHAGEAL ECHOCARDIOGRAM (TEE);  Surgeon: Laurey Morale, MD;  Location: [facility] ENDOSCOPY;  Service: Cardiovascular;  Laterality: N/A;  . Cardioversion N/A 04/29/2012    Procedure: CARDIOVERSION;  Surgeon: Laurey Morale, MD;  Location: [facility] ENDOSCOPY;  Service: Cardiovascular;  Laterality: N/A;    Current Outpatient Prescriptions  Medication Sig Dispense Refill  . apixaban (ELIQUIS) 5 MG  TABS tablet Take 1 tablet (5 mg total) by mouth 2 (two) times daily. 180 tablet 3  . calcium citrate-vitamin D (CITRACAL+D) 315-200 MG-UNIT per tablet Take 1 tablet by mouth daily.      . cetirizine (ZYRTEC) 10 MG tablet Take 10 mg by mouth daily.      . flecainide (TAMBOCOR) 50 MG tablet Take 1 tablet (50 mg total) by mouth 2 (two) times daily. 180 tablet 3  . fluticasone (FLONASE) 50 MCG/ACT nasal spray Place 1 spray into both nostrils daily as needed for allergies or rhinitis.    [name] Kitchen losartan (COZAAR) 100 MG tablet Take 1 tablet (100 mg total) by mouth daily. 90 tablet 3  . metoprolol succinate (TOPROL-XL) 50 MG 24 hr tablet Take 1 tablet (50 mg total) by mouth daily. Take with or immediately following a meal. 90 tablet 3  . montelukast (SINGULAIR) 10 MG tablet Take 1 tablet (10 mg total) by mouth at bedtime. 100 tablet 3  . Multiple Vitamins-Minerals (ONE-A-DAY MENS 50+ ADVANTAGE PO) Take 1 capsule by mouth daily.    [name] Kitchen omeprazole (PRILOSEC) 20 MG capsule Take 20 mg by mouth daily as needed (heartburn or acid reflux).    . vitamin C (ASCORBIC ACID) 500 MG tablet Take 500 mg by mouth daily.      [name] Kitchen zinc gluconate 50 MG tablet Take 50 mg by mouth daily.       No current facility-administered medications for  this encounter.    Allergies  Allergen Reactions  . Hydrochlorothiazide Hives and Itching    Social History   Social History  . Marital Status: Married    Spouse Name: N/A  . Number of Children: N/A  . Years of Education: N/A   Occupational History  . Not on file.   Social History Main Topics  . Smoking status: Never Smoker   . Smokeless tobacco: Never Used  . Alcohol Use: Yes     Comment: occasional  . Drug Use: No  . Sexual Activity: Yes   Other Topics Concern  . Not on file   Social History Narrative   Married; retired; single; gets regular exercise.    Family History  Problem Relation Age of Onset  . Atrial fibrillation Son     ROS- All systems are reviewed  and negative except as per the HPI above  Physical Exam: Filed Vitals:   11/30/14 0839  BP: 134/78  Pulse: 61  Height: 6' (1.829 m)  Weight: 198 lb 6.4 oz (89.994 kg)    GEN- The patient is well appearing, alert and oriented x 3 today.   Head- normocephalic, atraumatic Eyes-  Sclera clear, conjunctiva pink Ears- hearing intact Oropharynx- clear Neck- supple, no JVP Lymph- no cervical lymphadenopathy Lungs- Clear to ausculation bilaterally, normal work of breathing Heart- Regular rate and rhythm, no murmurs, rubs or gallops, PMI not laterally displaced GI- soft, NT, ND, + BS Extremities- no clubbing, cyanosis, or edema MS- no significant deformity or atrophy Skin- no rash or lesion Psych- euthymic mood, full affect Neuro- strength and sensation are intact  EKG-Normal sinus rhythm at 61 bpm, normal EKG. Pr int 190 ms, QRS 96 ms, QTc 418 ms.   Echo- TEE- 4/2014Left ventricle: The cavity size was normal. Wall thickness was normal. Systolic function was low normal to mildly decreased. The estimated ejection fraction was in the range of 50% to 55%. Wall motion was normal; there were no regional wall motion abnormalities. - Aortic valve: There was no stenosis. Trivial regurgitation. - Aorta: Normal caliber, minimal plaque. - Mitral valve: Trivial regurgitation. - Left atrium: The atrium was mildly to moderately dilated. No evidence of thrombus in the atrial cavity or appendage. - Right ventricle: The cavity size was normal. - Right atrium: No evidence of thrombus in the atrial cavity or appendage. - Atrial septum: No defect or patent foramen ovale was identified. Echo contrast study showed no right-to-left atrial level shunt, at baseline or with provocation. - Tricuspid valve: Peak RV-RA gradient: 14mm Hg (S).   Assessment and Plan:  1. PAF Maintaining SR on flecainide 50 mg bid. Continue metoprolol succinate 50 mg qd Chadsvasc of 1- continue  eliquis If continue with spells of gout and no other medical reason can be found, can try to switch to another antiarrythmic.  2. HTN Stable  No changes   F/u with Dr. Johney Frame yearly Afib clinic in 3 months, sooner if wants to try another antiarrythmic.  Elvina Sidle [facility] [address] [place], [place] [address] [phone]

## 2014-11-30 NOTE — Patient Instructions (Signed)
Prednisone 20 mg............ 2 tabs for 3 days then taper as outlined  Allopurinol 100 mg........[name] Kitchen 1 tab daily.......... forever  Decrease your alcohol intake  Uric acid level today........[name] Kitchen we will call you the report next week  We will also check an A1c........ avoid carbohydrates because of your blood sugar elevation

## 2014-11-30 NOTE — Progress Notes (Signed)
Pre visit review using our clinic review tool, if applicable. No additional management support is needed unless otherwise documented below in the visit note. 

## 2014-12-06 ENCOUNTER — Other Ambulatory Visit: Payer: Self-pay | Admitting: *Deleted

## 2014-12-06 MED ORDER — AZELASTINE HCL 0.1 % NA SOLN: 1.0000 | Freq: Two times a day (BID) | NASAL | Status: DC

## 2015-02-21 ENCOUNTER — Encounter: Payer: Self-pay | Admitting: Gastroenterology

## 2015-03-02 ENCOUNTER — Inpatient Hospital Stay (HOSPITAL_COMMUNITY)
Admission: RE | Admit: 2015-03-02 | Payer: BC Managed Care – PPO | Source: Ambulatory Visit | Admitting: Nurse Practitioner

## 2015-03-29 ENCOUNTER — Encounter: Payer: Self-pay | Admitting: Gastroenterology

## 2015-05-18 ENCOUNTER — Encounter: Payer: Self-pay | Admitting: Gastroenterology

## 2015-05-18 ENCOUNTER — Telehealth: Payer: Self-pay

## 2015-05-18 ENCOUNTER — Ambulatory Visit (INDEPENDENT_AMBULATORY_CARE_PROVIDER_SITE_OTHER): Payer: BC Managed Care – PPO | Admitting: Gastroenterology

## 2015-05-18 VITALS — BP 118/72 | HR 64 | Ht 70.5 in | Wt 194.8 lb

## 2015-05-18 DIAGNOSIS — I480 Paroxysmal atrial fibrillation: Secondary | ICD-10-CM

## 2015-05-18 DIAGNOSIS — Z1211 Encounter for screening for malignant neoplasm of colon: Secondary | ICD-10-CM

## 2015-05-18 NOTE — Progress Notes (Signed)
[facility] Progress Note   Chief Complaint: Screening colonoscopy  Subjective History:  Last colonoscopy with Arlyce Dice Jan, 2007 - sigmoid diverticulosis, no polyps Has paroxysmal A fib, on eliquis Denies family Hx CR, denies abdominal pain, rectal bleeding or change in bowel habits  ROS: Cardiovascular:  no chest pain Respiratory: no dyspnea  The patient's Past Medical, Family and Social History were reviewed and are on file in the EMR.  I reviewed Dr Jenel Lucks last Cardiology note.Low CHADS score  Objective:  Med list reviewed  Vital signs in last 24 hrs: Filed Vitals:   05/18/15 1005  BP: 118/72  Pulse: 64    Physical Exam   HEENT: sclera anicteric, oral mucosa moist without lesions  Neck: supple, no thyromegaly, JVD or lymphadenopathy  Cardiac: RRR without murmurs, S1S2 heard, no peripheral edema  Pulm: clear to auscultation bilaterally, normal RR and effort noted  Abdomen: soft, no tenderness, with active bowel sounds. No guarding or palpable hepatosplenomegaly.  Skin; warm and dry, no jaundice or rash   @ASSESSMENTPLANBEGIN @ Assessment: Encounter Diagnoses  Name Primary?  . Special screening for malignant neoplasms, colon   . Paroxysmal atrial fibrillation (HCC) Yes     Plan: Screening colonoscopy.  Stop Eliquis 2 days prior and our office will communicate with Dr Johney Frame regarding this.  Appears to be low risk to stop Eliquis before procedure, and potentially for up to 5 days after procedure if polypectomy performed.   Charlie Pitter III

## 2015-05-18 NOTE — Telephone Encounter (Signed)
05/18/2015   RE: GRANDERSON FLADGER DOB: May 12, 1955 MRN: [id number]   Dear Dr Johney Frame,   We have scheduled the above patient for an endoscopic procedure (colonoscopy). Our records show that he is on anticoagulation therapy.   Please advise as to how long the patient may come off his therapy of Eliquis prior to the procedure, which is scheduled for 06-10-2015.  Please fax back/ or route the completed form to Elon Spanner, CMA at [phone].   Sincerely,  Alexis Frock

## 2015-05-18 NOTE — Telephone Encounter (Signed)
Will forward to anticoagulation clinic per protocol 

## 2015-05-18 NOTE — Patient Instructions (Signed)
If you are age 61 or older, your body mass index should be between 23-30. Your Body mass index is 27.55 kg/(m^2). If this is out of the aforementioned range listed, please consider follow up with your Primary Care Provider.  If you are age 51 or younger, your body mass index should be between 19-25. Your Body mass index is 27.55 kg/(m^2). If this is out of the aformentioned range listed, please consider follow up with your Primary Care Provider.   You have been scheduled for a colonoscopy. Please follow written instructions given to you at your visit today.  Please pick up your prep supplies at the pharmacy within the next 1-3 days. If you use inhalers (even only as needed), please bring them with you on the day of your procedure. Your physician has requested that you go to www.startemmi.com and enter the access code given to you at your visit today. This web site gives a general overview about your procedure. However, you should still follow specific instructions given to you by our office regarding your preparation for the procedure.  Thank you for choosing [facility]  Dr Amada Jupiter III

## 2015-05-19 NOTE — Telephone Encounter (Signed)
Pt contacted and notified about blood thinner directions. He states clear understanding to hold his Eliquis.

## 2015-05-19 NOTE — Telephone Encounter (Addendum)
Pt on Eliquis for afib, CHADS2 score is 1 (HTN). Usually hold Eliquis for 24 hours prior to colonoscopies, however Dr. Myrtie Neither requesting 48 hours per office note from 5/3. This is ok since pt is low risk for stroke. Clearance routed back to Elon Spanner, CMA.

## 2015-05-27 ENCOUNTER — Encounter: Payer: Self-pay | Admitting: Gastroenterology

## 2015-06-10 ENCOUNTER — Ambulatory Visit (AMBULATORY_SURGERY_CENTER): Payer: BC Managed Care – PPO | Admitting: Gastroenterology

## 2015-06-10 ENCOUNTER — Encounter: Payer: Self-pay | Admitting: Gastroenterology

## 2015-06-10 VITALS — BP 118/67 | HR 49 | Temp 98.9°F | Resp 11 | Ht 70.5 in | Wt 194.0 lb

## 2015-06-10 DIAGNOSIS — Z1211 Encounter for screening for malignant neoplasm of colon: Secondary | ICD-10-CM

## 2015-06-10 MED ORDER — SODIUM CHLORIDE 0.9 % IV SOLN: 500.0000 mL | INTRAVENOUS | Status: DC

## 2015-06-10 NOTE — Op Note (Signed)
[facility] Patient Name: Roston Grunewald Procedure Date: 06/10/2015 2:23 PM MRN: [id number] Endoscopist: Sherilyn Cooter L. Myrtie Neither , MD Age: 60 Referring MD:  Date of Birth: [birth year] Gender: Male Procedure:                Colonoscopy Indications:              Screening for colorectal malignant neoplasm Medicines:                Monitored Anesthesia Care Procedure:                Pre-Anesthesia Assessment:                           - Prior to the procedure, a History and Physical                            was performed, and patient medications and                            allergies were reviewed. The patient's tolerance of                            previous anesthesia was also reviewed. The risks                            and benefits of the procedure and the sedation                            options and risks were discussed with the patient.                            All questions were answered, and informed consent                            was obtained. Prior Anticoagulants: The patient has                            taken Eliquis (apixaban), last dose was 2 days                            prior to procedure. ASA Grade Assessment: III - A                            patient with severe systemic disease. After                            reviewing the risks and benefits, the patient was                            deemed in satisfactory condition to undergo the                            procedure.  After obtaining informed consent, the colonoscope                            was passed under direct vision. Throughout the                            procedure, the patient's blood pressure, pulse, and                            oxygen saturations were monitored continuously. The                            Model CF-HQ190L [id number]) scope was introduced                            through the anus and advanced to the the cecum,   identified by appendiceal orifice and ileocecal                            valve. The colonoscopy was performed without                            difficulty. The patient tolerated the procedure                            well. The quality of the bowel preparation was                            excellent. The ileocecal valve, appendiceal                            orifice, and rectum were photographed. The bowel                            preparation used was Miralax. Scope In: 2:28:18 PM Scope Out: 2:38:10 PM Scope Withdrawal Time: 0 hours 8 minutes 31 seconds  Total Procedure Duration: 0 hours 9 minutes 52 seconds  Findings:                 The perianal and digital rectal examinations were                            normal.                           The entire examined colon appeared normal on direct                            and retroflexion views. Complications:            No immediate complications. Estimated Blood Loss:     Estimated blood loss: none. Impression:               - The entire examined colon is normal on direct and  retroflexion views.                           - No specimens collected. Recommendation:           - Patient has a contact number available for                            emergencies. The signs and symptoms of potential                            delayed complications were discussed with the                            patient. Return to normal activities tomorrow.                            Written discharge instructions were provided to the                            patient.                           - Resume previous diet.                           - Resume Eliquis (apixaban) at prior dose today.                           - Repeat colonoscopy in 10 years for screening                            purposes. Henry L. Myrtie Neither, MD 06/10/2015 2:41:26 PM This report has been signed electronically.

## 2015-06-10 NOTE — Progress Notes (Signed)
Stable to RR 

## 2015-06-10 NOTE — Patient Instructions (Addendum)
YOU HAD AN ENDOSCOPIC PROCEDURE TODAY AT THE [facility]:   Refer to the procedure report that was given to you for any specific questions about what was found during the examination.  If the procedure report does not answer your questions, please call your gastroenterologist to clarify.  If you requested that your care partner not be given the details of your procedure findings, then the procedure report has been included in a sealed envelope for you to review at your convenience later.  YOU SHOULD EXPECT: Some feelings of bloating in the abdomen. Passage of more gas than usual.  Walking can help get rid of the air that was put into your GI tract during the procedure and reduce the bloating. If you had a lower endoscopy (such as a colonoscopy or flexible sigmoidoscopy) you may notice spotting of blood in your stool or on the toilet paper. If you underwent a bowel prep for your procedure, you may not have a normal bowel movement for a few days.  Please Note:  You might notice some irritation and congestion in your nose or some drainage.  This is from the oxygen used during your procedure.  There is no need for concern and it should clear up in a day or so.  SYMPTOMS TO REPORT IMMEDIATELY:   Following lower endoscopy (colonoscopy or flexible sigmoidoscopy):  Excessive amounts of blood in the stool  Significant tenderness or worsening of abdominal pains  Swelling of the abdomen that is new, acute  Fever of 100F or higher   For urgent or emergent issues, a gastroenterologist can be reached at any hour by calling [phone].   DIET: Your first meal following the procedure should be a small meal and then it is ok to progress to your normal diet. Heavy or fried foods are harder to digest and may make you feel nauseous or bloated.  Likewise, meals heavy in dairy and vegetables can increase bloating.  Drink plenty of fluids but you should avoid alcoholic beverages for 24  hours.  ACTIVITY:  You should plan to take it easy for the rest of today and you should NOT DRIVE or use heavy machinery until tomorrow (because of the sedation medicines used during the test).    FOLLOW UP: Our staff will call the number listed on your records the next business day following your procedure to check on you and address any questions or concerns that you may have regarding the information given to you following your procedure. If we do not reach you, we will leave a message.  However, if you are feeling well and you are not experiencing any problems, there is no need to return our call.  We will assume that you have returned to your regular daily activities without incident.  If any biopsies were taken you will be contacted by phone or by letter within the next 1-3 weeks.  Please call us at [phone] if you have not heard about the biopsies in 3 weeks.    SIGNATURES/CONFIDENTIALITY: You and/or your care partner have signed paperwork which will be entered into your electronic medical record.  These signatures attest to the fact that that the information above on your After Visit Summary has been reviewed and is understood.  Full responsibility of the confidentiality of this discharge information lies with you and/or your care-partner.  Normal exam.  Repeat colonoscopy in 10 years 2027.  Resume Eliquis to day 06/10/15.

## 2015-06-14 ENCOUNTER — Telehealth: Payer: Self-pay

## 2015-06-14 NOTE — Telephone Encounter (Signed)
  Follow up Call-  Call back number 06/10/2015  Post procedure Call Back phone  # [phone]  Permission to leave phone message Yes     Patient was called for follow up after his procedure on 06/10/2015. No answer at the number given for follow up phone call. A message was left on his answering machine.

## 2015-08-19 ENCOUNTER — Other Ambulatory Visit: Payer: Self-pay | Admitting: Internal Medicine

## 2015-12-05 ENCOUNTER — Ambulatory Visit (INDEPENDENT_AMBULATORY_CARE_PROVIDER_SITE_OTHER): Payer: BC Managed Care – PPO | Admitting: Internal Medicine

## 2015-12-05 ENCOUNTER — Encounter: Payer: Self-pay | Admitting: Internal Medicine

## 2015-12-05 VITALS — BP 138/80 | HR 53 | Ht 72.0 in | Wt 198.0 lb

## 2015-12-05 DIAGNOSIS — I10 Essential (primary) hypertension: Secondary | ICD-10-CM | POA: Diagnosis not present

## 2015-12-05 DIAGNOSIS — I480 Paroxysmal atrial fibrillation: Secondary | ICD-10-CM | POA: Diagnosis not present

## 2015-12-05 MED ORDER — FLECAINIDE ACETATE 50 MG PO TABS: ORAL_TABLET | ORAL | 3 refills | Status: DC

## 2015-12-05 MED ORDER — APIXABAN 5 MG PO TABS: ORAL_TABLET | ORAL | 3 refills | Status: DC

## 2015-12-05 MED ORDER — METOPROLOL SUCCINATE ER 50 MG PO TB24: 25.0000 mg | ORAL_TABLET | Freq: Every day | ORAL | 3 refills | Status: DC

## 2015-12-05 MED ORDER — METOPROLOL SUCCINATE ER 50 MG PO TB24: 50.0000 mg | ORAL_TABLET | Freq: Every day | ORAL | 3 refills | Status: DC

## 2015-12-05 NOTE — Progress Notes (Signed)
Electrophysiology Office Note   Date:  12/05/2015   ID:  Alvin AschoffJulian M Sudduth, DOB 01/02/1956, MRN [id number]  PCP:  Evette GeorgesDD,JEFFREY ALLEN, MD  Cardiologist:  Dr Shirlee LatchMcLean Primary Electrophysiologist: Hillis RangeJames Tresea Heine, MD    Chief Complaint  Patient presents with  . Atrial Fibrillation     History of Present Illness: Al[place] is a 60 y.o. male who presents today for electrophysiology evaluation.   He has not had afib in a year.  Gout is also well controlled.  He has cut drinking down to 2 beers or 2 glasses of wine 4-5 days per week. Today, he denies symptoms of palpitations, chest pain, shortness of breath, orthopnea, PND, lower extremity edema, claudication, dizziness, presyncope, syncope, bleeding, or neurologic sequela. The patient is tolerating medications without difficulties and is otherwise without complaint today.    Past Medical History:  Diagnosis Date  . Allergy   . AR (allergic rhinitis)   . Eczema   . Fracture of left humerus, medial epicondyle     med. epi. not specified   . GERD (gastroesophageal reflux disease)   . HTN (hypertension)   . Paroxysmal atrial fibrillation (HCC)    chads2vasc score is 1   Past Surgical History:  Procedure Laterality Date  . APPENDECTOMY    . CARDIOVERSION N/A 04/29/2012   Procedure: CARDIOVERSION;  Surgeon: Laurey Moralealton S McLean, MD;  [facility];  Service: Cardiovascular;  Laterality: N/A;  . COLONOSCOPY  01/26/05  . left arm sx     humerus sx plate and screws  . TEE WITHOUT CARDIOVERSION N/A 04/29/2012   Procedure: TRANSESOPHAGEAL ECHOCARDIOGRAM (TEE);  Surgeon: Laurey Moralealton S McLean, MD;  [facility];  Service: Cardiovascular;  Laterality: N/A;     Current Outpatient Prescriptions  Medication Sig Dispense Refill  . allopurinol (ZYLOPRIM) 100 MG tablet Take 1 tablet (100 mg total) by mouth daily. 100 tablet 4  . azelastine (ASTELIN) 0.1 % nasal spray Place 1 spray into both nostrils 2 (two) times daily. Use in each  nostril as directed 30 mL 12  . calcium citrate-vitamin D (CITRACAL+D) 315-200 MG-UNIT per tablet Take 1 tablet by mouth daily.      . cetirizine (ZYRTEC) 10 MG tablet Take 10 mg by mouth daily.      [name] Kitchen. ELIQUIS 5 MG TABS tablet TAKE 1 TABLET (5 MG TOTAL) BY MOUTH 2 TIMES DAILY. 180 tablet 0  . flecainide (TAMBOCOR) 50 MG tablet TAKE 1 TABLET (50 MG TOTAL) BY MOUTH 2 TIMES DAILY. 180 tablet 0  . losartan (COZAAR) 100 MG tablet Take 1 tablet (100 mg total) by mouth daily. 90 tablet 3  . metoprolol succinate (TOPROL-XL) 50 MG 24 hr tablet Take 1 tablet (50 mg total) by mouth daily. Take with or immediately following a meal. 90 tablet 3  . montelukast (SINGULAIR) 10 MG tablet Take 1 tablet (10 mg total) by mouth at bedtime. 100 tablet 3  . Multiple Vitamins-Minerals (ONE-A-DAY MENS 50+ ADVANTAGE PO) Take 1 capsule by mouth daily.    [name] Kitchen. omeprazole (PRILOSEC) 20 MG capsule Take 1 capsule (20 mg total) by mouth daily as needed (heartburn or acid reflux). 100 capsule 3  . vitamin C (ASCORBIC ACID) 500 MG tablet Take 500 mg by mouth daily.      [name] Kitchen. zinc gluconate 50 MG tablet Take 50 mg by mouth daily.       No current facility-administered medications for this visit.     Allergies:   Hydrochlorothiazide   Social History:  The patient  reports that he has never smoked. He has never used smokeless tobacco. He reports that he drinks alcohol. He reports that he does not use drugs.   Family History:  The patient's  family history includes Atrial fibrillation in his son; Heart disease in his father.    ROS:  Please see the history of present illness.   All other systems are reviewed and negative.    PHYSICAL EXAM: VS:  BP 138/80   Pulse (!) 53   Ht 6' (1.829 m)   Wt 198 lb (89.8 kg)   BMI 26.85 kg/m  , BMI Body mass index is 26.85 kg/m. GEN: Well nourished, well developed, in no acute distress  HEENT: normal  Neck: no JVD, carotid bruits, or masses Cardiac: RRR; no murmurs, rubs, or gallops,no  edema  Respiratory:  clear to auscultation bilaterally, normal work of breathing GI: soft, nontender, nondistended, + BS MS: no deformity or atrophy  Skin: warm and dry  Neuro:  Strength and sensation are intact Psych: euthymic mood, full affect  EKG:  EKG is ordered today. The ekg ordered today shows sinus bradycardia 53 bpm, otherwise normal ekg  Lipid Panel     Component Value Date/Time   CHOL 163 11/23/2014 0806   TRIG 170.0 (H) 11/23/2014 0806   HDL 35.30 (L) 11/23/2014 0806   CHOLHDL 5 11/23/2014 0806   VLDL 34.0 11/23/2014 0806   LDLCALC 93 11/23/2014 0806     Wt Readings from Last 3 Encounters:  12/05/15 198 lb (89.8 kg)  06/10/15 194 lb (88 kg)  05/18/15 194 lb 12.8 oz (88.4 kg)     Other studies Reviewed: Additional studies/ records that were reviewed today include: AF clinic notes, prior echos  Review of the above records today demonstrates: preserved EF   ASSESSMENT AND PLAN:  1.  Paroxysmal atrial fibrillation Well controlled with flecainide.   Decrease toprol to 25mg  daily chads2vasc score is 1.  Doing well on eliquis  2. HTN Stable Will follow with lower dose of toprol  3. ETOH Avoidance strongly encouraged  Return to see me in 1 year Follow-up in AF clinic if AF returns in the interim  Signed, Hillis Range, MD  12/05/2015 10:21 AM     [facility] [address] [place] [address] [phone] (office) [phone] (fax)

## 2015-12-05 NOTE — Patient Instructions (Signed)
Medication Instructions:  Your physician has recommended you make the following change in your medication:  1) Decrease Toprol to 25 mg daily   Labwork: None ordered   Testing/Procedures: None ordered   Follow-Up: Your physician recommends that you schedule a follow-up appointment in 12 months with Dr Johney Frame   Any Other Special Instructions Will Be Listed Below (If Applicable).     If you need a refill on your cardiac medications before your next appointment, please call your pharmacy.

## 2015-12-05 NOTE — Addendum Note (Signed)
Addended by: Dennis Bast F on: 12/05/2015 10:25 AM   Modules accepted: Orders

## 2015-12-27 ENCOUNTER — Other Ambulatory Visit (INDEPENDENT_AMBULATORY_CARE_PROVIDER_SITE_OTHER): Payer: BC Managed Care – PPO

## 2015-12-27 DIAGNOSIS — Z0000 Encounter for general adult medical examination without abnormal findings: Secondary | ICD-10-CM

## 2015-12-27 LAB — LIPID PANEL
CHOLESTEROL: 169 mg/dL (ref 0–200)
HDL: 34.7 mg/dL — ABNORMAL LOW (ref >39.00)
LDL CALC: 106 mg/dL — AB (ref 0–99)
NonHDL: 134.14
Total CHOL/HDL Ratio: 5
Triglycerides: 141 mg/dL (ref 0.0–149.0)
VLDL: 28.2 mg/dL (ref 0.0–40.0)

## 2015-12-27 LAB — POC URINALSYSI DIPSTICK (AUTOMATED)
BILIRUBIN UA: NEGATIVE
Glucose, UA: NEGATIVE
Ketones, UA: NEGATIVE
LEUKOCYTES UA: NEGATIVE
NITRITE UA: NEGATIVE
PH UA: 6
PROTEIN UA: NEGATIVE
RBC UA: NEGATIVE
Spec Grav, UA: 1.02
Urobilinogen, UA: 0.2

## 2015-12-27 LAB — CBC WITH DIFFERENTIAL/PLATELET
Basophils Absolute: 0 10*3/uL (ref 0.0–0.1)
Basophils Relative: 0.6 % (ref 0.0–3.0)
EOS PCT: 2.5 % (ref 0.0–5.0)
Eosinophils Absolute: 0.2 10*3/uL (ref 0.0–0.7)
HCT: 44.6 % (ref 39.0–52.0)
Hemoglobin: 15.1 g/dL (ref 13.0–17.0)
Lymphocytes Relative: 43 % (ref 12.0–46.0)
Lymphs Abs: 3.1 10*3/uL (ref 0.7–4.0)
MCHC: 33.9 g/dL (ref 30.0–36.0)
MCV: 88.3 fl (ref 78.0–100.0)
MONOS PCT: 9.3 % (ref 3.0–12.0)
Monocytes Absolute: 0.7 10*3/uL (ref 0.1–1.0)
Neutro Abs: 3.2 10*3/uL (ref 1.4–7.7)
Neutrophils Relative %: 44.6 % (ref 43.0–77.0)
Platelets: 168 10*3/uL (ref 150.0–400.0)
RBC: 5.05 Mil/uL (ref 4.22–5.81)
RDW: 12.7 % (ref 11.5–15.5)
WBC: 7.1 10*3/uL (ref 4.0–10.5)

## 2015-12-27 LAB — BASIC METABOLIC PANEL
BUN: 18 mg/dL (ref 6–23)
CO2: 29 mEq/L (ref 19–32)
Calcium: 8.7 mg/dL (ref 8.4–10.5)
Chloride: 103 mEq/L (ref 96–112)
Creatinine, Ser: 1.06 mg/dL (ref 0.40–1.50)
GFR: 75.28 mL/min (ref >60.00)
Glucose, Bld: 103 mg/dL — ABNORMAL HIGH (ref 70–99)
POTASSIUM: 4.5 meq/L (ref 3.5–5.1)
SODIUM: 139 meq/L (ref 135–145)

## 2015-12-27 LAB — PSA: PSA: 3.2 ng/mL (ref 0.10–4.00)

## 2015-12-27 LAB — HEPATIC FUNCTION PANEL
ALBUMIN: 4.1 g/dL (ref 3.5–5.2)
ALT: 44 U/L (ref 0–53)
AST: 31 U/L (ref 0–37)
Alkaline Phosphatase: 52 U/L (ref 39–117)
Bilirubin, Direct: 0.1 mg/dL (ref 0.0–0.3)
Total Bilirubin: 0.5 mg/dL (ref 0.2–1.2)
Total Protein: 6.1 g/dL (ref 6.0–8.3)

## 2015-12-27 LAB — TSH: TSH: 1.45 u[IU]/mL (ref 0.35–4.50)

## 2016-01-03 ENCOUNTER — Encounter: Payer: BC Managed Care – PPO | Admitting: Family Medicine

## 2016-01-04 ENCOUNTER — Encounter: Payer: Self-pay | Admitting: Family Medicine

## 2016-01-04 ENCOUNTER — Ambulatory Visit (INDEPENDENT_AMBULATORY_CARE_PROVIDER_SITE_OTHER): Payer: BC Managed Care – PPO | Admitting: Family Medicine

## 2016-01-04 VITALS — BP 130/74 | HR 75 | Temp 97.5°F | Ht 72.0 in | Wt 199.2 lb

## 2016-01-04 DIAGNOSIS — J301 Allergic rhinitis due to pollen: Secondary | ICD-10-CM | POA: Diagnosis not present

## 2016-01-04 DIAGNOSIS — I10 Essential (primary) hypertension: Secondary | ICD-10-CM | POA: Diagnosis not present

## 2016-01-04 DIAGNOSIS — I4891 Unspecified atrial fibrillation: Secondary | ICD-10-CM

## 2016-01-04 MED ORDER — AZELASTINE HCL 0.1 % NA SOLN: 1.0000 | Freq: Two times a day (BID) | NASAL | 12 refills | Status: DC

## 2016-01-04 MED ORDER — MONTELUKAST SODIUM 10 MG PO TABS: 10.0000 mg | ORAL_TABLET | Freq: Every day | ORAL | 4 refills | Status: DC

## 2016-01-04 MED ORDER — LOSARTAN POTASSIUM 100 MG PO TABS: 100.0000 mg | ORAL_TABLET | Freq: Every day | ORAL | 4 refills | Status: DC

## 2016-01-04 MED ORDER — OMEPRAZOLE 20 MG PO CPDR: 20.0000 mg | DELAYED_RELEASE_CAPSULE | Freq: Every day | ORAL | 3 refills | Status: DC | PRN

## 2016-01-04 NOTE — Patient Instructions (Signed)
Continue current medications  Follow-up in one year sooner if any problem 

## 2016-01-04 NOTE — Progress Notes (Signed)
Pre visit review using our clinic review tool, if applicable. No additional management support is needed unless otherwise documented below in the visit note. 

## 2016-01-04 NOTE — Progress Notes (Signed)
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 He stopped the allopurinol was decreased his alcohol consumption no flare of gout  He has a history of A. fib. He's currently on eliquis 5 mg twice a day, flecainide 50 mg twice a day, Toprol the dose is been dropped to 25 from 50 daily. He wants to know if he can come off of the blood thinner. Encouraged him to call his cardiologist.  He also uses Astelin nasal spray Zyrtec and Singulair for allergic rhinitis  He also takes Cozaar 100 mg daily for hypertension. 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 The family has over [place] acres of land.  BP 130/74 (BP Location: Left Arm, Patient Position: Sitting, Cuff Size: Normal)   Pulse 75   Temp 97.5 F (36.4 C) (Oral)   Ht 6' (1.829 m)   Wt 199 lb 3.2 oz (90.4 kg)   BMI 27.02 kg/m  Examination HEENT was negative neck was supple no adenopathy thyroid normal no carotid bruits cardiopulmonary exam normal regular sinus rhythm. Abdominal exam normal GEN 10 normal circumcised male rectum normal stool guaiac-negative prostate normal extremities normal skin normal peripheral pulses normal  #1 A. Fib.......[name] Kitchen sinus rhythm.... Patient concerned about long-term anticoagulation....... asked him to call his cardiologist  #2 history of gout...... asymptomatic off allopurinol and decrease alcohol consumption  #3 hypertension at goal....... continue Cozaar 100 mg daily  #4 allergic rhinitis..... Continue Astelin, Singulair, and Zyrtec plain  #5 reflux esophagitis...[name] KitchenMarland Kitchen Prilosec when necessary.

## 2016-12-10 ENCOUNTER — Ambulatory Visit: Payer: BC Managed Care – PPO | Admitting: Internal Medicine

## 2016-12-19 ENCOUNTER — Other Ambulatory Visit: Payer: Self-pay | Admitting: Internal Medicine

## 2016-12-31 ENCOUNTER — Other Ambulatory Visit: Payer: BC Managed Care – PPO

## 2017-01-07 ENCOUNTER — Encounter: Payer: BC Managed Care – PPO | Admitting: Family Medicine

## 2017-01-11 ENCOUNTER — Other Ambulatory Visit: Payer: Self-pay | Admitting: Family Medicine

## 2017-01-11 ENCOUNTER — Other Ambulatory Visit: Payer: Self-pay | Admitting: Internal Medicine

## 2017-01-11 DIAGNOSIS — I480 Paroxysmal atrial fibrillation: Secondary | ICD-10-CM

## 2017-01-11 DIAGNOSIS — I10 Essential (primary) hypertension: Secondary | ICD-10-CM

## 2017-01-11 NOTE — Telephone Encounter (Signed)
This is a A-fib pt. °

## 2017-01-21 ENCOUNTER — Ambulatory Visit: Payer: BC Managed Care – PPO | Admitting: Internal Medicine

## 2017-01-21 ENCOUNTER — Encounter: Payer: Self-pay | Admitting: Internal Medicine

## 2017-01-21 VITALS — BP 152/74 | HR 56 | Ht 72.0 in | Wt 199.4 lb

## 2017-01-21 DIAGNOSIS — I480 Paroxysmal atrial fibrillation: Secondary | ICD-10-CM

## 2017-01-21 MED ORDER — FLECAINIDE ACETATE 50 MG PO TABS: ORAL_TABLET | ORAL | 3 refills | Status: DC

## 2017-01-21 MED ORDER — APIXABAN 5 MG PO TABS: 5.0000 mg | ORAL_TABLET | Freq: Two times a day (BID) | ORAL | 3 refills | Status: DC

## 2017-01-21 MED ORDER — METOPROLOL SUCCINATE ER 50 MG PO TB24: 50.0000 mg | ORAL_TABLET | Freq: Every day | ORAL | 3 refills | Status: DC

## 2017-01-21 NOTE — Progress Notes (Signed)
PCP: Roderick Pee, MD Primary Cardiologist: Dr Shirlee Latch Primary EP: Dr Johney Frame  [place] is a 62 y.o. male who presents today for routine electrophysiology followup.  Since last being seen in our clinic, the patient reports doing very well.  Today, he denies symptoms of palpitations, chest pain, shortness of breath,  lower extremity edema, dizziness, presyncope, or syncope.  The patient is otherwise without complaint today.   Past Medical History:  Diagnosis Date  . Allergy   . AR (allergic rhinitis)   . Eczema   . Fracture of left humerus, medial epicondyle     med. epi. not specified   . GERD (gastroesophageal reflux disease)   . HTN (hypertension)   . Paroxysmal atrial fibrillation (HCC)    chads2vasc score is 1   Past Surgical History:  Procedure Laterality Date  . APPENDECTOMY    . CARDIOVERSION N/A 04/29/2012   Procedure: CARDIOVERSION;  Surgeon: Laurey Morale, MD;  Location: [facility] ENDOSCOPY;  Service: Cardiovascular;  Laterality: N/A;  . COLONOSCOPY  01/26/05  . left arm sx     humerus sx plate and screws  . TEE WITHOUT CARDIOVERSION N/A 04/29/2012   Procedure: TRANSESOPHAGEAL ECHOCARDIOGRAM (TEE);  Surgeon: Laurey Morale, MD;  Location: [facility] ENDOSCOPY;  Service: Cardiovascular;  Laterality: N/A;    ROS- all systems are reviewed and negatives except as per HPI above  Current Outpatient Medications  Medication Sig Dispense Refill  . azelastine (ASTELIN) 0.1 % nasal spray Place 1 spray into both nostrils 2 (two) times daily. Use in each nostril as directed 30 mL 12  . calcium citrate-vitamin D (CITRACAL+D) 315-200 MG-UNIT per tablet Take 1 tablet by mouth daily.      . cetirizine (ZYRTEC) 10 MG tablet Take 10 mg by mouth daily.      [name] Kitchen ELIQUIS 5 MG TABS tablet TAKE 1 TABLET BY MOUTH 2 TIMES DAILY. 180 tablet 1  . flecainide (TAMBOCOR) 50 MG tablet TAKE 1 TABLET (50 MG TOTAL) BY MOUTH 2 TIMES DAILY. 180 tablet 3  . losartan (COZAAR) 100 MG tablet TAKE 1 TABLET BY  MOUTH DAILY. 90 tablet 1  . metoprolol succinate (TOPROL-XL) 50 MG 24 hr tablet Take 1 tablet (50 mg total) by mouth daily. Please keep upcoming appt in January for future refills. Thank you 90 tablet 0  . montelukast (SINGULAIR) 10 MG tablet Take 1 tablet (10 mg total) by mouth at bedtime. 100 tablet 4  . Multiple Vitamins-Minerals (ONE-A-DAY MENS 50+ ADVANTAGE PO) Take 1 capsule by mouth daily.    [name] Kitchen omeprazole (PRILOSEC) 20 MG capsule Take 1 capsule (20 mg total) by mouth daily as needed (heartburn or acid reflux). 100 capsule 3  . zinc gluconate 50 MG tablet Take 50 mg by mouth daily.       No current facility-administered medications for this visit.     Physical Exam: Vitals:   01/21/17 1007  BP: (!) 152/74  Pulse: (!) 56  Weight: 199 lb 6.4 oz (90.4 kg)  Height: 6' (1.829 m)    GEN- The patient is well appearing, alert and oriented x 3 today.   Head- normocephalic, atraumatic Eyes-  Sclera clear, conjunctiva pink Ears- hearing intact Oropharynx- clear Lungs- Clear to ausculation bilaterally, normal work of breathing Heart- Regular rate and rhythm, no murmurs, rubs or gallops, PMI not laterally displaced GI- soft, NT, ND, + BS Extremities- no clubbing, cyanosis, or edema  EKG tracing ordered today is personally reviewed and shows sinus rhythm 56 bpm,  otherwise normal ekg  Assessment and Plan:  1. Paroxysmal atrial fibrillation Doing well with flecainide chads2vasc score is 1.  On eliquis  2. HTN Elevated Could consider additing diuretic, though I worry about its impact on gout.  No changes today.  He will follow-up with Dr Tawanna Cooler tomorrow as scheduled.  3. ETOH Avoidance again encouraged  Return to see EP PA in a year unless problems arise in the interim.  Hillis Range MD, [facility] 01/21/2017 10:26 AM

## 2017-01-21 NOTE — Patient Instructions (Addendum)
Medication Instructions:  Your physician recommends that you continue on your current medications as directed. Please refer to the Current Medication list given to you today.  * If you need a refill on your cardiac medications before your next appointment, please call your pharmacy. *  Labwork: None ordered  Testing/Procedures: None ordered  Follow-Up: Your physician wants you to follow-up in: 1 year with Francis Dowse, PA.   You will receive a reminder letter in the mail two months in advance. If you don't receive a letter, please call our office to schedule the follow-up appointment.  Thank you for choosing [facility]!!

## 2017-01-22 ENCOUNTER — Ambulatory Visit (INDEPENDENT_AMBULATORY_CARE_PROVIDER_SITE_OTHER): Payer: BC Managed Care – PPO | Admitting: Family Medicine

## 2017-01-22 ENCOUNTER — Encounter: Payer: Self-pay | Admitting: Family Medicine

## 2017-01-22 VITALS — BP 128/86 | HR 52 | Temp 98.3°F | Ht 72.0 in | Wt 195.0 lb

## 2017-01-22 DIAGNOSIS — J301 Allergic rhinitis due to pollen: Secondary | ICD-10-CM

## 2017-01-22 DIAGNOSIS — I4891 Unspecified atrial fibrillation: Secondary | ICD-10-CM

## 2017-01-22 DIAGNOSIS — Z0000 Encounter for general adult medical examination without abnormal findings: Secondary | ICD-10-CM | POA: Diagnosis not present

## 2017-01-22 DIAGNOSIS — I10 Essential (primary) hypertension: Secondary | ICD-10-CM

## 2017-01-22 LAB — POCT URINALYSIS DIPSTICK
Bilirubin, UA: NEGATIVE
Glucose, UA: NEGATIVE
Ketones, UA: NEGATIVE
LEUKOCYTES UA: NEGATIVE
NITRITE UA: NEGATIVE
PROTEIN UA: NEGATIVE
RBC UA: NEGATIVE
Spec Grav, UA: 1.025 (ref 1.010–1.025)
UROBILINOGEN UA: 0.2 U/dL
pH, UA: 6 (ref 5.0–8.0)

## 2017-01-22 LAB — HEPATIC FUNCTION PANEL
ALT: 22 U/L (ref 0–53)
AST: 20 U/L (ref 0–37)
Albumin: 4.1 g/dL (ref 3.5–5.2)
Alkaline Phosphatase: 53 U/L (ref 39–117)
BILIRUBIN TOTAL: 0.7 mg/dL (ref 0.2–1.2)
Bilirubin, Direct: 0.1 mg/dL (ref 0.0–0.3)
TOTAL PROTEIN: 6 g/dL (ref 6.0–8.3)

## 2017-01-22 LAB — PSA: PSA: 3.23 ng/mL (ref 0.10–4.00)

## 2017-01-22 LAB — BASIC METABOLIC PANEL
BUN: 20 mg/dL (ref 6–23)
CHLORIDE: 103 meq/L (ref 96–112)
CO2: 30 meq/L (ref 19–32)
Calcium: 9 mg/dL (ref 8.4–10.5)
Creatinine, Ser: 1.12 mg/dL (ref 0.40–1.50)
GFR: 70.4 mL/min (ref >60.00)
Glucose, Bld: 111 mg/dL — ABNORMAL HIGH (ref 70–99)
POTASSIUM: 4.7 meq/L (ref 3.5–5.1)
Sodium: 138 mEq/L (ref 135–145)

## 2017-01-22 LAB — CBC WITH DIFFERENTIAL/PLATELET
BASOS ABS: 0 10*3/uL (ref 0.0–0.1)
BASOS PCT: 0.5 % (ref 0.0–3.0)
EOS ABS: 0.1 10*3/uL (ref 0.0–0.7)
Eosinophils Relative: 2.3 % (ref 0.0–5.0)
HEMATOCRIT: 44.9 % (ref 39.0–52.0)
HEMOGLOBIN: 15.1 g/dL (ref 13.0–17.0)
LYMPHS PCT: 37.4 % (ref 12.0–46.0)
Lymphs Abs: 2.3 10*3/uL (ref 0.7–4.0)
MCHC: 33.8 g/dL (ref 30.0–36.0)
MCV: 89.6 fl (ref 78.0–100.0)
MONO ABS: 0.5 10*3/uL (ref 0.1–1.0)
Monocytes Relative: 7.9 % (ref 3.0–12.0)
Neutro Abs: 3.2 10*3/uL (ref 1.4–7.7)
Neutrophils Relative %: 51.9 % (ref 43.0–77.0)
Platelets: 150 10*3/uL (ref 150.0–400.0)
RBC: 5 Mil/uL (ref 4.22–5.81)
RDW: 13.2 % (ref 11.5–15.5)
WBC: 6.1 10*3/uL (ref 4.0–10.5)

## 2017-01-22 LAB — LIPID PANEL
CHOL/HDL RATIO: 4
Cholesterol: 157 mg/dL (ref 0–200)
HDL: 35.2 mg/dL — ABNORMAL LOW (ref >39.00)
LDL CALC: 101 mg/dL — AB (ref 0–99)
NonHDL: 121.74
TRIGLYCERIDES: 102 mg/dL (ref 0.0–149.0)
VLDL: 20.4 mg/dL (ref 0.0–40.0)

## 2017-01-22 LAB — TSH: TSH: 1.33 u[IU]/mL (ref 0.35–4.50)

## 2017-01-22 MED ORDER — LOSARTAN POTASSIUM 100 MG PO TABS: 100.0000 mg | ORAL_TABLET | Freq: Every day | ORAL | 4 refills | Status: DC

## 2017-01-22 MED ORDER — OMEPRAZOLE 20 MG PO CPDR: 20.0000 mg | DELAYED_RELEASE_CAPSULE | Freq: Every day | ORAL | 3 refills | Status: DC | PRN

## 2017-01-22 MED ORDER — AZELASTINE HCL 0.1 % NA SOLN: 1.0000 | Freq: Two times a day (BID) | NASAL | 12 refills | Status: DC

## 2017-01-22 MED ORDER — MONTELUKAST SODIUM 10 MG PO TABS: 10.0000 mg | ORAL_TABLET | Freq: Every day | ORAL | 4 refills | Status: DC

## 2017-01-22 NOTE — Progress Notes (Signed)
Alvin Morrison is a 62 year old married male nonsmoker who comes in today for annual physical examination  He saw Dr. Hillis Range yesterday. Evaluation is normal except his blood pressure was slightly elevated. BP today here 120/86  He currently takes Cozaar 100 mg daily along with Toprol 50 mg daily. He takes Raelyn Number and 50 mg twice a day and L at [facility] because of a history of PAF  He says he feels well no complications feels like he is in sinus rhythm most of the time.  He takes Singulair for allergic rhinitis  He takes Prilosec 20 mg daily because a history of reflux esophagitis.  He gets routine eye care, dental care, colonoscopy 2017 normal  Vaccinations up-to-date information given on the new shingles vaccine.  14 point review of systems reviewed and otherwise negative  Social history........[place] married lives here in [place]. It's a family property is been passed down through many generations. 2 sons one in [place] one in [place] both employed and 4 grandkids now.  Retired Futures trader  EKG done by Dr. Fawn Kirk........ therefore not repeated  BP 128/86 (BP Location: Left Arm, Patient Position: Sitting, Cuff Size: Normal)   Pulse (!) 52   Temp 98.3 F (36.8 C) (Oral)   Ht 6' (1.829 m)   Wt 195 lb (88.5 kg)   BMI 26.45 kg/m  Jellies well-developed well-nourished male no acute distress examination HEENT was negative neck was supple thyroid is not enlarged no carotid bruits cardiopulmonary exam normal abdominal exam normal 10 to normal circumcised male he does have a hydrocele in the right. Rectal normal stool guaiac-negative prostate normal extremities normal skin normal peripheral pulses normal except for some hyperpigmentation to his back.  #1 healthy male  #2 hypertension at goal.....[place] continue current therapy monitor blood pressure at home......... BP goal 135/85 or less  #3 PAF......... asymptomatic sinus  rhythm......[place] medications per Dr. Fawn Kirk  #4 allergic rhinitis..... Continue current meds  #5 reflux esophagitis......... continue Prilosec...  [place]

## 2017-01-22 NOTE — Patient Instructions (Signed)
Labs today.... I will call if is anything abnormal  Keep up your current diet and exercise program  Monitor your blood pressure weekly........... BP goal 135/85 or less  Return in one year for general physical exam sooner if any problems  Let me know who the new folks are staffing the new office you wish to switch to.[name] KitchenMarland Kitchen

## 2017-06-28 ENCOUNTER — Ambulatory Visit: Payer: Self-pay | Admitting: Family Medicine

## 2017-06-28 ENCOUNTER — Ambulatory Visit: Payer: Self-pay | Admitting: *Deleted

## 2017-06-28 NOTE — Telephone Encounter (Signed)
Patient reports symptoms of facial pain / headache stuffy nose with pain l side face symptoms started yesterday. Has been taking tylenol and nasal sprays for the symptoms. He reports a history of allergies in past and reports has had prednisone called in the past by his PCP.Patient advised and  recommended office visit within 3 days . Patient declined at this time and states he will try home care and if worse or no improvement will call and make an appointment.  Reason for Disposition . [1] Using nasal washes and pain medicine > 24 hours AND [2] sinus pain (around cheekbone or eye) persists  Answer Assessment - Initial Assessment Questions 1. LOCATION: "Where does it hurt?"       L side face  And  Head   2. ONSET: "When did the sinus pain start?"  (e.g., hours, days)        Yest   3. SEVERITY: "How bad is the pain?"   (Scale 1-10; mild, moderate or severe)   - MILD (1-3): doesn't interfere with normal activities    - MODERATE (4-7): interferes with normal activities (e.g., work or school) or awakens from sleep   - SEVERE (8-10): excruciating pain and patient unable to do any normal activities            Mod  7  4. RECURRENT SYMPTOM: "Have you ever had sinus problems before?" If so, ask: "When was the last time?" and "What happened that time?"      Yes    2  Years   5. NASAL CONGESTION: "Is the nose blocked?" If so, ask, "Can you open it or must you breathe through the mouth?"       Congested  relieved  By nasal spray  6. NASAL DISCHARGE: "Do you have discharge from your nose?" If so ask, "What color?"         Clear drainage   7. FEVER: "Do you have a fever?" If so, ask: "What is it, how was it measured, and when did it start?"       No symptems   8. OTHER SYMPTOMS: "Do you have any other symptoms?" (e.g., sore throat, cough, earache, difficulty breathing)        sorethroat yesterday earache yest  l  Side   9. PREGNANCY: "Is there any chance you are pregnant?" "When was your last menstrual  period?"     N/a  Protocols used: SINUS PAIN OR CONGESTION-A-AH

## 2017-06-28 NOTE — Telephone Encounter (Signed)
Triage encounter already completed please disregard

## 2017-06-28 NOTE — Telephone Encounter (Signed)
Noted  

## 2017-11-07 ENCOUNTER — Encounter: Payer: Self-pay | Admitting: Family Medicine

## 2018-01-23 ENCOUNTER — Ambulatory Visit: Payer: BC Managed Care – PPO | Admitting: Nurse Practitioner

## 2018-01-23 VITALS — BP 160/82 | HR 55 | Ht 72.0 in | Wt 195.0 lb

## 2018-01-23 DIAGNOSIS — I480 Paroxysmal atrial fibrillation: Secondary | ICD-10-CM | POA: Diagnosis not present

## 2018-01-23 DIAGNOSIS — I10 Essential (primary) hypertension: Secondary | ICD-10-CM

## 2018-01-23 MED ORDER — METOPROLOL SUCCINATE ER 50 MG PO TB24: 50.0000 mg | ORAL_TABLET | Freq: Every day | ORAL | 3 refills | Status: DC

## 2018-01-23 MED ORDER — FLECAINIDE ACETATE 50 MG PO TABS: ORAL_TABLET | ORAL | 3 refills | Status: DC

## 2018-01-23 NOTE — Patient Instructions (Signed)
Medication Instructions:  none If you need a refill on your cardiac medications before your next appointment, please call your pharmacy.   Lab work: none If you have labs (blood work) drawn today and your tests are completely normal, you will receive your results only by: . MyChart Message (if you have MyChart) OR . A paper copy in the mail If you have any lab test that is abnormal or we need to change your treatment, we will call you to review the results.  Testing/Procedures: none  Follow-Up: At [facility], you and your health needs are our priority.  As part of our continuing mission to provide you with exceptional heart care, we have created designated Provider Care Teams.  These Care Teams include your primary Cardiologist (physician) and Advanced Practice Providers (APPs -  Physician Assistants and Nurse Practitioners) who all work together to provide you with the care you need, when you need it. You will need a follow up appointment in 1 years.  Please call our office 2 months in advance to schedule this appointment.  You may see Dr Allred or one of the following Advanced Practice Providers on your designated Care Team:   Amber Seiler, NP . Renee Ursuy, PA-C  Any Other Special Instructions Will Be Listed Below (If Applicable).    

## 2018-01-23 NOTE — Progress Notes (Signed)
Electrophysiology Office Note Date: 01/23/2018  ID:  Alvin Morrison, DOB 07-05-1955, MRN [id number]  PCP: Mliss Sax, MD Primary Cardiologist: Shirlee Latch Electrophysiologist: Allred  CC: AF follow up  Alvin Morrison is a 63 y.o. male seen today for Dr Johney Frame.  He presents today for routine electrophysiology followup.  Since last being seen in our clinic, the patient reports doing very well. 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 He enjoys spending time with his 4 grandchildren.  He denies chest pain, palpitations, dyspnea, PND, orthopnea, nausea, vomiting, dizziness, syncope, edema, weight gain, or early satiety. He has not had bleeding complications.   Past Medical History:  Diagnosis Date  . Allergy   . AR (allergic rhinitis)   . Eczema   . Fracture of left humerus, medial epicondyle     med. epi. not specified   . GERD (gastroesophageal reflux disease)   . HTN (hypertension)   . Paroxysmal atrial fibrillation (HCC)    chads2vasc score is 1   Past Surgical History:  Procedure Laterality Date  . APPENDECTOMY    . CARDIOVERSION N/A 04/29/2012   Procedure: CARDIOVERSION;  Surgeon: Laurey Morale, MD;  Location: [facility] ENDOSCOPY;  Service: Cardiovascular;  Laterality: N/A;  . COLONOSCOPY  01/26/05  . left arm sx     humerus sx plate and screws  . TEE WITHOUT CARDIOVERSION N/A 04/29/2012   Procedure: TRANSESOPHAGEAL ECHOCARDIOGRAM (TEE);  Surgeon: Laurey Morale, MD;  Location: [facility] ENDOSCOPY;  Service: Cardiovascular;  Laterality: N/A;    Current Outpatient Medications  Medication Sig Dispense Refill  . apixaban (ELIQUIS) 5 MG TABS tablet Take 1 tablet (5 mg total) by mouth 2 (two) times daily. 180 tablet 3  . azelastine (ASTELIN) 0.1 % nasal spray Place 1 spray into both nostrils 2 (two) times daily. Use in each nostril as directed 30 mL 12  . calcium citrate-vitamin D (CITRACAL+D) 315-200 MG-UNIT per tablet Take 1 tablet by mouth daily.      .  cetirizine (ZYRTEC) 10 MG tablet Take 10 mg by mouth daily.      . flecainide (TAMBOCOR) 50 MG tablet TAKE 1 TABLET (50 MG TOTAL) BY MOUTH 2 TIMES DAILY. 180 tablet 3  . losartan (COZAAR) 100 MG tablet Take 1 tablet (100 mg total) by mouth daily. 90 tablet 4  . metoprolol succinate (TOPROL-XL) 50 MG 24 hr tablet Take 1 tablet (50 mg total) by mouth daily. 90 tablet 3  . montelukast (SINGULAIR) 10 MG tablet Take 1 tablet (10 mg total) by mouth at bedtime. 100 tablet 4  . Multiple Vitamins-Minerals (ONE-A-DAY MENS 50+ ADVANTAGE PO) Take 1 capsule by mouth daily.    [name] Kitchen omeprazole (PRILOSEC) 20 MG capsule Take 1 capsule (20 mg total) by mouth daily as needed (heartburn or acid reflux). 100 capsule 3  . zinc gluconate 50 MG tablet Take 50 mg by mouth daily.       No current facility-administered medications for this visit.     Allergies:   Hydrochlorothiazide   Social History: Social History   Socioeconomic History  . Marital status: Married    Spouse name: Not on file  . Number of children: Not on file  . Years of education: Not on file  . Highest education level: Not on file  Occupational History  . Not on file  Social Needs  . Financial resource strain: Not on file  . Food insecurity:    Worry: Not on file  Inability: Not on file  . Transportation needs:    Medical: Not on file    Non-medical: Not on file  Tobacco Use  . Smoking status: Never Smoker  . Smokeless tobacco: Never Used  Substance and Sexual Activity  . Alcohol use: Yes    Alcohol/week: 0.0 standard drinks    Comment: occasional  . Drug use: No  . Sexual activity: Yes  Lifestyle  . Physical activity:    Days per week: Not on file    Minutes per session: Not on file  . Stress: Not on file  Relationships  . Social connections:    Talks on phone: Not on file    Gets together: Not on file    Attends religious service: Not on file    Active member of club or organization: Not on file    Attends meetings of  clubs or organizations: Not on file    Relationship status: Not on file  . Intimate partner violence:    Fear of current or ex partner: Not on file    Emotionally abused: Not on file    Physically abused: Not on file    Forced sexual activity: Not on file  Other Topics Concern  . Not on file  Social History Narrative   Married; retired; single; gets regular exercise.    Family History: Family History  Problem Relation Age of Onset  . Heart disease Father   . Atrial fibrillation Son     Review of Systems: All other systems reviewed and are otherwise negative except as noted above.   Physical Exam: VS:  BP (!) 160/82   Pulse (!) 55   Ht 6' (1.829 m)   Wt 195 lb (88.5 kg)   BMI 26.45 kg/m  , BMI Body mass index is 26.45 kg/m. Wt Readings from Last 3 Encounters:  01/23/18 195 lb (88.5 kg)  01/22/17 195 lb (88.5 kg)  01/21/17 199 lb 6.4 oz (90.4 kg)    GEN- The patient is well appearing, alert and oriented x 3 today.   HEENT: normocephalic, atraumatic; sclera clear, conjunctiva pink; hearing intact; oropharynx clear; neck supple  Lungs- Clear to ausculation bilaterally, normal work of breathing.  No wheezes, rales, rhonchi Heart- Regular rate and rhythm  GI- soft, non-tender, non-distended, bowel sounds present  Extremities- no clubbing, cyanosis, or edema  MS- no significant deformity or atrophy Skin- warm and dry, no rash or lesion  Psych- euthymic mood, full affect Neuro- strength and sensation are intact   EKG:  EKG is ordered today. The ekg ordered today shows sinus brady, rate 55, PR , QRS , QTc  Recent Labs: No results found for requested labs within last 8760 hours.    Other studies Reviewed: Additional studies/ records that were reviewed today include: Dr Jenel Lucks office note   Assessment and Plan:  1.  Paroxysmal atrial fibrillation No clinical recurrence on Flecainide EKG stable today Continue Eliquis for CHADS2VASC of  1 CBC, BMET with PCP tomorrow  2.  HTN Elevated today in clinic but at home SBP in the 130's No change today   Current medicines are reviewed at length with the patient today.   The patient does not have concerns regarding his medicines.  The following changes were made today:  none  Labs/ tests ordered today include: Orders Placed This Encounter  Procedures  . EKG 12-Lead     Disposition:   Follow up with Dr Johney Frame 1 year    Signed, Gypsy Balsam,  NP 01/23/2018 9:51 AM   [facility] [address] [place] [address] [phone] (office) [phone] (fax)

## 2018-01-24 ENCOUNTER — Ambulatory Visit (INDEPENDENT_AMBULATORY_CARE_PROVIDER_SITE_OTHER): Payer: BC Managed Care – PPO | Admitting: Family Medicine

## 2018-01-24 ENCOUNTER — Other Ambulatory Visit: Payer: Self-pay | Admitting: Family Medicine

## 2018-01-24 ENCOUNTER — Encounter: Payer: Self-pay | Admitting: Family Medicine

## 2018-01-24 VITALS — BP 134/80 | HR 50 | Ht 72.0 in | Wt 195.0 lb

## 2018-01-24 DIAGNOSIS — J301 Allergic rhinitis due to pollen: Secondary | ICD-10-CM

## 2018-01-24 DIAGNOSIS — I10 Essential (primary) hypertension: Secondary | ICD-10-CM

## 2018-01-24 DIAGNOSIS — Z125 Encounter for screening for malignant neoplasm of prostate: Secondary | ICD-10-CM | POA: Diagnosis not present

## 2018-01-24 DIAGNOSIS — Z0000 Encounter for general adult medical examination without abnormal findings: Secondary | ICD-10-CM

## 2018-01-24 LAB — LIPID PANEL
CHOL/HDL RATIO: 4
Cholesterol: 170 mg/dL (ref 0–200)
HDL: 40 mg/dL (ref >39.00)
LDL Cholesterol: 114 mg/dL — ABNORMAL HIGH (ref 0–99)
NONHDL: 129.88
Triglycerides: 77 mg/dL (ref 0.0–149.0)
VLDL: 15.4 mg/dL (ref 0.0–40.0)

## 2018-01-24 LAB — BASIC METABOLIC PANEL
BUN: 20 mg/dL (ref 6–23)
CALCIUM: 9.3 mg/dL (ref 8.4–10.5)
CO2: 29 mEq/L (ref 19–32)
Chloride: 103 mEq/L (ref 96–112)
Creatinine, Ser: 1.1 mg/dL (ref 0.40–1.50)
GFR: 71.65 mL/min (ref >60.00)
GLUCOSE: 107 mg/dL — AB (ref 70–99)
POTASSIUM: 4.4 meq/L (ref 3.5–5.1)
SODIUM: 138 meq/L (ref 135–145)

## 2018-01-24 LAB — VITAMIN D 25 HYDROXY (VIT D DEFICIENCY, FRACTURES): VITD: 29.94 ng/mL — ABNORMAL LOW (ref 30.00–100.00)

## 2018-01-24 LAB — HEPATIC FUNCTION PANEL
ALBUMIN: 4.1 g/dL (ref 3.5–5.2)
ALK PHOS: 52 U/L (ref 39–117)
ALT: 26 U/L (ref 0–53)
AST: 22 U/L (ref 0–37)
BILIRUBIN TOTAL: 0.8 mg/dL (ref 0.2–1.2)
Bilirubin, Direct: 0.2 mg/dL (ref 0.0–0.3)
TOTAL PROTEIN: 6.2 g/dL (ref 6.0–8.3)

## 2018-01-24 LAB — CBC
HCT: 44.6 % (ref 39.0–52.0)
HEMOGLOBIN: 15.1 g/dL (ref 13.0–17.0)
MCHC: 33.8 g/dL (ref 30.0–36.0)
MCV: 88.6 fl (ref 78.0–100.0)
PLATELETS: 144 10*3/uL — AB (ref 150.0–400.0)
RBC: 5.03 Mil/uL (ref 4.22–5.81)
RDW: 13 % (ref 11.5–15.5)
WBC: 7 10*3/uL (ref 4.0–10.5)

## 2018-01-24 LAB — URINALYSIS, ROUTINE W REFLEX MICROSCOPIC
BILIRUBIN URINE: NEGATIVE
Hgb urine dipstick: NEGATIVE
KETONES UR: NEGATIVE
LEUKOCYTES UA: NEGATIVE
Nitrite: NEGATIVE
PH: 5.5 (ref 5.0–8.0)
RBC / HPF: NONE SEEN (ref 0–?)
SPECIFIC GRAVITY, URINE: 1.025 (ref 1.000–1.030)
TOTAL PROTEIN, URINE-UPE24: NEGATIVE
URINE GLUCOSE: NEGATIVE
UROBILINOGEN UA: 0.2 (ref 0.0–1.0)
WBC, UA: NONE SEEN (ref 0–?)

## 2018-01-24 LAB — PSA: PSA: 3.31 ng/mL (ref 0.10–4.00)

## 2018-01-24 MED ORDER — OMEPRAZOLE 20 MG PO CPDR: 20.0000 mg | DELAYED_RELEASE_CAPSULE | Freq: Every day | ORAL | 1 refills | Status: DC | PRN

## 2018-01-24 MED ORDER — APIXABAN 5 MG PO TABS: 5.0000 mg | ORAL_TABLET | Freq: Two times a day (BID) | ORAL | 1 refills | Status: DC

## 2018-01-24 MED ORDER — LOSARTAN POTASSIUM 100 MG PO TABS: 100.0000 mg | ORAL_TABLET | Freq: Every day | ORAL | 1 refills | Status: DC

## 2018-01-24 MED ORDER — MONTELUKAST SODIUM 10 MG PO TABS: 10.0000 mg | ORAL_TABLET | Freq: Every day | ORAL | 1 refills | Status: DC

## 2018-01-24 NOTE — Patient Instructions (Signed)

## 2018-01-24 NOTE — Progress Notes (Addendum)
Established Patient Office Visit  Subjective:  Patient ID: Alvin Morrison, male    DOB: [birth year]  Age: 63 y.o. MRN: [id number]  CC:  Chief Complaint  Patient presents with  . Annual Exam    HPI [name] presents for establishment of care by way of transfer and a complete physical exam.  He is retired and has been doing quite well.  He exercises by walking hiking and helping to care for his grandchildren.  He does not smoke or use illicit drugs.  He rarely drinks alcohol.  He lives with his wife.  He has 2 grown sons.  One lives in [place] and the other lives in [place].  He spends a lot of time at his house outside of [place].  Past medical history of atrial fibrillation with cardiomyopathy.  He is also seeing cardiology.  Was seen by cardiology yesterday and has been doing well.  He has remained in sinus rhythm as far as he knows since starting flecainide some years ago.  Urine flow is good.  He does experience nocturia up to 2 times nightly.  He does drink water just before retiring.  He is moving his bowels regularly without issue there.  He has regular eye and dental checks as well.  Both parents are 43.  They still live independently.  They are divorced.  Father is still driving.  Mom has had a couple of strokes and her energy and get up and go is been waning over the last few years.  Past Medical History:  Diagnosis Date  . Allergy   . AR (allergic rhinitis)   . Eczema   . Fracture of left humerus, medial epicondyle     med. epi. not specified   . GERD (gastroesophageal reflux disease)   . HTN (hypertension)   . Paroxysmal atrial fibrillation (HCC)    chads2vasc score is 1    Past Surgical History:  Procedure Laterality Date  . APPENDECTOMY    . CARDIOVERSION N/A 04/29/2012   Procedure: CARDIOVERSION;  Surgeon: Laurey Morale, MD;  Location[facility] ENDOSCOPY;  Service: Cardiovascular;  Laterality: N/A;  . COLONOSCOPY  01/26/05  . left arm sx     humerus sx plate  and screws  . TEE WITHOUT CARDIOVERSION N/A 04/29/2012   Procedure: TRANSESOPHAGEAL ECHOCARDIOGRAM (TEE);  Surgeon: Laurey Morale, MD;  Location: [facility] ENDOSCOPY;  Service: Cardiovascular;  Laterality: N/A;    Family History  Problem Relation Age of Onset  . Heart disease Father   . Atrial fibrillation Son     Social History   Socioeconomic History  . Marital status: Married    Spouse name: Not on file  . Number of children: Not on file  . Years of education: Not on file  . Highest education level: Not on file  Occupational History  . Not on file  Social Needs  . Financial resource strain: Not on file  . Food insecurity:    Worry: Not on file    Inability: Not on file  . Transportation needs:    Medical: Not on file    Non-medical: Not on file  Tobacco Use  . Smoking status: Never Smoker  . Smokeless tobacco: Never Used  Substance and Sexual Activity  . Alcohol use: Yes    Alcohol/week: 0.0 standard drinks    Comment: occasional  . Drug use: No  . Sexual activity: Yes  Lifestyle  . Physical activity:    Days per week: Not on file  Minutes per session: Not on file  . Stress: Not on file  Relationships  . Social connections:    Talks on phone: Not on file    Gets together: Not on file    Attends religious service: Not on file    Active member of club or organization: Not on file    Attends meetings of clubs or organizations: Not on file    Relationship status: Not on file  . Intimate partner violence:    Fear of current or ex partner: Not on file    Emotionally abused: Not on file    Physically abused: Not on file    Forced sexual activity: Not on file  Other Topics Concern  . Not on file  Social History Narrative   Married; retired; single; gets regular exercise.    Outpatient Medications Prior to Visit  Medication Sig Dispense Refill  . azelastine (ASTELIN) 0.1 % nasal spray Place 1 spray into both nostrils 2 (two) times daily. Use in each nostril as  directed 30 mL 12  . calcium citrate-vitamin D (CITRACAL+D) 315-200 MG-UNIT per tablet Take 1 tablet by mouth daily.      . cetirizine (ZYRTEC) 10 MG tablet Take 10 mg by mouth daily.      . flecainide (TAMBOCOR) 50 MG tablet TAKE 1 TABLET (50 MG TOTAL) BY MOUTH 2 TIMES DAILY. 180 tablet 3  . metoprolol succinate (TOPROL-XL) 50 MG 24 hr tablet Take 1 tablet (50 mg total) by mouth daily. 90 tablet 3  . Multiple Vitamins-Minerals (ONE-A-DAY MENS 50+ ADVANTAGE PO) Take 1 capsule by mouth daily.    [name] Kitchen zinc gluconate 50 MG tablet Take 50 mg by mouth daily.      [name] Kitchen apixaban (ELIQUIS) 5 MG TABS tablet Take 1 tablet (5 mg total) by mouth 2 (two) times daily. 180 tablet 3  . losartan (COZAAR) 100 MG tablet Take 1 tablet (100 mg total) by mouth daily. 90 tablet 4  . montelukast (SINGULAIR) 10 MG tablet Take 1 tablet (10 mg total) by mouth at bedtime. 100 tablet 4  . omeprazole (PRILOSEC) 20 MG capsule Take 1 capsule (20 mg total) by mouth daily as needed (heartburn or acid reflux). 100 capsule 3   No facility-administered medications prior to visit.     Allergies  Allergen Reactions  . Hydrochlorothiazide Hives and Itching    ROS Review of Systems  Constitutional: Negative for chills, diaphoresis, fatigue, fever and unexpected weight change.  HENT: Negative.   Eyes: Negative for photophobia and visual disturbance.  Respiratory: Negative.  Negative for chest tightness and shortness of breath.   Cardiovascular: Negative.  Negative for chest pain.  Gastrointestinal: Negative.   Endocrine: Negative for polyphagia and polyuria.  Genitourinary: Negative for decreased urine volume, difficulty urinating and frequency.  Musculoskeletal: Negative for gait problem and joint swelling.  Skin: Positive for rash.  Allergic/Immunologic: Negative for immunocompromised state.  Neurological: Negative for light-headedness and headaches.  Hematological: Does not bruise/bleed easily.  Psychiatric/Behavioral:  Negative.       Objective:    Physical Exam  Constitutional: He is oriented to person, place, and time. He appears well-developed and well-nourished. No distress.  HENT:  Head: Normocephalic and atraumatic.  Right Ear: External ear normal.  Left Ear: External ear normal.  Mouth/Throat: Oropharynx is clear and moist. No oropharyngeal exudate.  Eyes: Pupils are equal, round, and reactive to light. Conjunctivae are normal. Right eye exhibits no discharge. Left eye exhibits no discharge. No scleral icterus.  Neck:  Neck supple. No JVD present. No tracheal deviation present. No thyromegaly present.  Cardiovascular: Normal rate, regular rhythm and normal heart sounds.  No extrasystoles are present.  Pulmonary/Chest: Effort normal and breath sounds normal. No stridor.  Abdominal: Soft. Bowel sounds are normal.  Genitourinary: Rectum:     Guaiac result negative.     No rectal mass, anal fissure, tenderness, external hemorrhoid, internal hemorrhoid or abnormal anal tone.  Prostate is not enlarged and not tender.  Lymphadenopathy:    He has no cervical adenopathy.  Neurological: He is alert and oriented to person, place, and time.  Skin: Skin is warm and dry. He is not diaphoretic.     Psychiatric: He has a normal mood and affect. His behavior is normal.    BP 134/80   Pulse (!) 50   Ht 6' (1.829 m)   Wt 195 lb (88.5 kg)   SpO2 95%   BMI 26.45 kg/m  Wt Readings from Last 3 Encounters:  01/24/18 195 lb (88.5 kg)  01/23/18 195 lb (88.5 kg)  01/22/17 195 lb (88.5 kg)   BP Readings from Last 3 Encounters:  01/24/18 134/80  01/23/18 (!) 160/82  01/22/17 128/86   Guideline developer:  UpToDate (see UpToDate for funding source) Date Released: June 2014  Health Maintenance Due  Topic Date Due  . Hepatitis C Screening  [birth year]  . HIV Screening  02/22/1970    There are no preventive care reminders to display for this patient.  Lab Results  Component Value Date   TSH 1.33  01/22/2017   Lab Results  Component Value Date   WBC 7.0 01/24/2018   HGB 15.1 01/24/2018   HCT 44.6 01/24/2018   MCV 88.6 01/24/2018   PLT 144.0 (L) 01/24/2018   Lab Results  Component Value Date   NA 138 01/24/2018   K 4.4 01/24/2018   CO2 29 01/24/2018   GLUCOSE 107 (H) 01/24/2018   BUN 20 01/24/2018   CREATININE 1.10 01/24/2018   BILITOT 0.8 01/24/2018   ALKPHOS 52 01/24/2018   AST 22 01/24/2018   ALT 26 01/24/2018   PROT 6.2 01/24/2018   ALBUMIN 4.1 01/24/2018   CALCIUM 9.3 01/24/2018   GFR 71.65 01/24/2018   Lab Results  Component Value Date   CHOL 170 01/24/2018   Lab Results  Component Value Date   HDL 40.00 01/24/2018   Lab Results  Component Value Date   LDLCALC 114 (H) 01/24/2018   Lab Results  Component Value Date   TRIG 77.0 01/24/2018   Lab Results  Component Value Date   CHOLHDL 4 01/24/2018   Lab Results  Component Value Date   HGBA1C 5.7 11/30/2014      Assessment & Plan:   Problem List Items Addressed This Visit      Other   Healthcare maintenance - Primary   Relevant Orders   Basic metabolic panel (Completed)   CBC (Completed)   Lipid panel (Completed)   PSA (Completed)   Urinalysis, Routine w reflex microscopic (Completed)   VITAMIN D 25 Hydroxy (Vit-D Deficiency, Fractures) (Completed)   Hepatic function panel (Completed)     Continue active and healthy lifestyle.  Information was given on health maintenance and disease prevention.  Follow-up in 6 months for routine health check and sooner as needed.  Patient was advised to avoid fluid intake but hours before bedtime.  Is also advised to take a multivitamin with vitamin D. No orders of the defined types were placed in this encounter.  Follow-up: Return in about 6 months (around 07/25/2018).

## 2018-01-24 NOTE — Telephone Encounter (Signed)
Copied from CRM [id number]. Topic: Quick Communication - Rx Refill/Question >> Jan 24, 2018 10:54 AM Burchel, Abbi R wrote: Medication: apixaban (ELIQUIS) 5 MG TABS tablet, losartan (COZAAR) 100 MG tablet, montelukast (SINGULAIR) 10 MG tablet, omeprazole (PRILOSEC) 20 MG capsule  Preferred Pharmacy: [facility] - [place], [place] - [address]  [phone] (Phone) [phone] (Fax)  Pt was advised that RX refills may take up to 3 business days. We ask that you follow-up with your pharmacy.

## 2018-01-27 ENCOUNTER — Other Ambulatory Visit: Payer: Self-pay | Admitting: Family Medicine

## 2018-01-27 MED ORDER — AZELASTINE HCL 0.1 % NA SOLN: 1.0000 | Freq: Two times a day (BID) | NASAL | 12 refills | Status: DC

## 2018-01-27 NOTE — Telephone Encounter (Signed)
Copied from CRM [id number]. Topic: Quick Communication - Rx Refill/Question >> Jan 27, 2018 10:46 AM Floria Raveling A wrote: Medication: azelastine (ASTELIN) 0.1 % nasal spray [[id number] pt meant to ask for refill on this one as well when he was in the office on Friday   Has the patient contacted their pharmacy? No  (Agent: If no, request that the patient contact the pharmacy for the refill.) (Agent: If yes, when and what did the pharmacy advise?)  Preferred Pharmacy (with phone number or street name): [facility] - [place], [place] - [address] [phone] (Phone)   Agent: Please be advised that RX refills may take up to 3 business days. We ask that you follow-up with your pharmacy.

## 2018-01-27 NOTE — Telephone Encounter (Signed)
Requested Prescriptions  Pending Prescriptions Disp Refills  . azelastine (ASTELIN) 0.1 % nasal spray 30 mL 12    Sig: Place 1 spray into both nostrils 2 (two) times daily. Use in each nostril as directed     Ear, Nose, and Throat: Nasal Preparations - Antiallergy Passed - 01/27/2018 12:12 PM      Passed - Valid encounter within last 12 months    Recent Outpatient Visits          3 days ago Healthcare maintenance   [facility], Talmadge Coventry, MD   1 year ago Essential hypertension   [facility], Eugenio Hoes, MD   2 years ago Essential hypertension   [facility], Eugenio Hoes, MD   3 years ago Needs flu shot   [facility], Eugenio Hoes, MD   4 years ago Essential hypertension   [facility], Eugenio Hoes, MD

## 2018-01-27 NOTE — Telephone Encounter (Signed)
Charted in error; disregard.

## 2018-09-23 ENCOUNTER — Other Ambulatory Visit: Payer: Self-pay | Admitting: Family Medicine

## 2018-10-07 ENCOUNTER — Other Ambulatory Visit: Payer: Self-pay | Admitting: Family Medicine

## 2018-10-07 DIAGNOSIS — J301 Allergic rhinitis due to pollen: Secondary | ICD-10-CM

## 2018-10-07 DIAGNOSIS — I10 Essential (primary) hypertension: Secondary | ICD-10-CM

## 2019-01-12 ENCOUNTER — Other Ambulatory Visit: Payer: Self-pay | Admitting: Family Medicine

## 2019-02-08 NOTE — Progress Notes (Signed)
Cardiology Office Note Date:  02/10/2019  Patient ID:  [name], [name] 1955-02-14, MRN [id number] PCP:  Libby Maw, MD  Cardiologist:  Dr. Aundra Dubin (2015 last) Electrophysiologist: Dr. Rayann Heman    Chief Complaint: annual EP visit  History of Present Illness: Alvin Morrison is a 64 y.o. male with history of HTN, very remote mild CM (45-50% with initial diagnosis of AF in 2012, recovered LVEF), paroxysmal Afib.  He comes in today to be seen for Dr. Rayann Heman.  Last seen by EP service with Genene Churn, NP Jan 2020, doing well, was retired from [employer].  His EKG intervals stable on flecainide, no symptoms of AF, labs pending with his PMD.  He is doing quite well.  Walks 6-34miles every day with his wife, or if raining or cold gets on the treadmill or elliptical, has very good exertional capacity and tolerance.  No CP, palpitations SOB or DOE.  No dizzy spells, no near syncope or syncope No sense of any Afib. No bleeding or signs of bleeding reports compliance with his Eliquis and all of his medicines.  He checks his BP at home, often 150/70 numbers, doesn'tthink the losartan has worked well for him.  He also mentions that of late in the evenings when relaxing he is aware of b/l heat.burning type sensation on the bottom of his feet from the balls of his feet towards toes including 3rd toe. No symptoms of claudication or pain otherwise.   AFib HX Diagnosed 2012 AAD DCCV 2014 Flecainide started 2016 is current  Past Medical History:  Diagnosis Date  . Allergy   . AR (allergic rhinitis)   . Eczema   . Fracture of left humerus, medial epicondyle     med. epi. not specified   . GERD (gastroesophageal reflux disease)   . HTN (hypertension)   . Paroxysmal atrial fibrillation (HCC)    chads2vasc score is 1    Past Surgical History:  Procedure Laterality Date  . APPENDECTOMY    . CARDIOVERSION N/A 04/29/2012   Procedure: CARDIOVERSION;  Surgeon: Larey Dresser, MD;   Location: [facility];  Service: Cardiovascular;  Laterality: N/A;  . COLONOSCOPY  01/26/05  . left arm sx     humerus sx plate and screws  . TEE WITHOUT CARDIOVERSION N/A 04/29/2012   Procedure: TRANSESOPHAGEAL ECHOCARDIOGRAM (TEE);  Surgeon: Larey Dresser, MD;  [facility] ENDOSCOPY;  Service: Cardiovascular;  Laterality: N/A;    Current Outpatient Medications  Medication Sig Dispense Refill  . azelastine (ASTELIN) 0.1 % nasal spray Place 1 spray into both nostrils 2 (two) times daily. Use in each nostril as directed 30 mL 12  . calcium citrate-vitamin D (CITRACAL+D) 315-200 MG-UNIT per tablet Take 1 tablet by mouth daily.      . cetirizine (ZYRTEC) 10 MG tablet Take 10 mg by mouth.     [name] 5 MG TABS tablet TAKE 1 TABLET BY MOUTH 2 TIMES DAILY. 180 tablet 1  . flecainide (TAMBOCOR) 50 MG tablet TAKE 1 TABLET (50 MG TOTAL) BY MOUTH 2 TIMES DAILY. 180 tablet 3  . losartan (COZAAR) 100 MG tablet Take 1 tablet (100 mg total) by mouth daily. 90 tablet 3  . metoprolol succinate (TOPROL-XL) 50 MG 24 hr tablet Take 1 tablet (50 mg total) by mouth daily. 90 tablet 3  . montelukast (SINGULAIR) 10 MG tablet TAKE 1 TABLET BY MOUTH AT BEDTIME. 90 tablet 1  . Multiple Vitamins-Minerals (ONE-A-DAY MENS 50+ ADVANTAGE PO) Take 1 capsule  by mouth daily.    [name] Kitchen omeprazole (PRILOSEC) 20 MG capsule TAKE 1 CAPSULE BY MOUTH DAILY AS NEEDED (HEARTBURN OR ACID REFLUX). 90 capsule 1  . Vitamin D, Ergocalciferol, (DRISDOL) 1.25 MG (50000 UNIT) CAPS capsule Take 1 capsule (50,000 Units total) by mouth every 7 (seven) days. 5 capsule 5  . zinc gluconate 50 MG tablet Take 50 mg by mouth daily.       No current facility-administered medications for this visit.    Allergies:   Hydrochlorothiazide   Social History:  The patient  reports that he has never smoked. He has never used smokeless tobacco. He reports current alcohol use of about 14.0 standard drinks of alcohol per week. He reports that he does not use  drugs.   Family History:  The patient's family history includes Atrial fibrillation in his son; Heart disease in his father.*  ROS:  Please see the history of present illness.  All other systems are reviewed and otherwise negative.   PHYSICAL EXAM:  VS:  BP (!) 148/82   Pulse (!) 54   Ht 5' 11.75" (1.822 m)   Wt 195 lb (88.5 kg)   BMI 26.63 kg/m  BMI: Body mass index is 26.63 kg/m. Well nourished, well developed, in no acute distress  HEENT: normocephalic, atraumatic  Neck: no JVD, carotid bruits or masses Cardiac:  RRR; no significant murmurs, no rubs, or gallops.  He has excellent 3+ and equal PT and DP pulses b/l, no skin changes, and brisk cap refill b/l, feet are warm b/l Lungs:   CTA b/l, no wheezing, rhonchi or rales  Abd: soft, nontender MS: no deformity or atrophy Ext: no edema  Skin: warm and dry, no rash Neuro:  No gross deficits appreciated Psych: euthymic mood, full affect   EKG:  Done today and reviewed by myself shows  SB 54bpm, Q III, aVf not new, EKG appears unchanged with stable intervals in comparison to last EKG   06/21/2014: ETT Baseline ECG is normal.  There was no ST segment deviation noted during stress.  Arrhythmias during stress: none.  Arrhythmias during recovery: none.  There were no significant arrhythmias noted during the test.   ECG was interpretable.   No flecainide induced EKG changes   04/29/2012: TEE Study Conclusions  - Left ventricle: The cavity size was normal. Wall thickness  was normal. Systolic function was low normal to mildly  decreased. The estimated ejection fraction was in the  range of 50% to 55%. Wall motion was normal; there were no  regional wall motion abnormalities.  - Aortic valve: There was no stenosis. Trivial  regurgitation.  - Aorta: Normal caliber, minimal plaque.  - Mitral valve: Trivial regurgitation.  - Left atrium: The atrium was mildly to moderately dilated.  No evidence of thrombus in the  atrial cavity or appendage.  - Right ventricle: The cavity size was normal.  - Right atrium: No evidence of thrombus in the atrial cavity  or appendage.  - Atrial septum: No defect or patent foramen ovale was  identified. Echo contrast study showed no right-to-left  atrial level shunt, at baseline or with provocation.  - Tricuspid valve: Peak RV-RA gradient: 20mm Hg (S).   Impressions:  - May proceed to cardioversion.   08/22/2010: stress myoview Impression Exercise Capacity:  Excellent exercise capacity. BP Response:  Normal blood pressure response. Clinical Symptoms:  No chest pain. ECG Impression:  No significant ST segment change suggestive of ischemia. Comparison with Prior Nuclear Study: No previous  nuclear study performed  Overall Impression:  Normal stress nuclear study.    Recent Labs: 02/09/2019: ALT 27; BUN 27; Creatinine, Ser 1.04; Hemoglobin 15.0; Platelets 148.0; Potassium 4.2; Sodium 137  02/09/2019: Cholesterol 175; Direct LDL 107.0; HDL 36.30; LDL Cholesterol 110; Total CHOL/HDL Ratio 5; Triglycerides 144.0; VLDL 28.8   Estimated Creatinine Clearance: 78.2 mL/min (by C-G formula based on SCr of 1.04 mg/dL).   Wt Readings from Last 3 Encounters:  02/10/19 195 lb (88.5 kg)  02/09/19 194 lb 9.6 oz (88.3 kg)  01/24/18 195 lb (88.5 kg)     Other studies reviewed: Additional studies/records reviewed today include: summarized above  ASSESSMENT AND PLAN:  1. Paroxysmal Afib     CHA2DS2Vasc is 1 (he will be 64 in a couple weeks will have score of 2)     On Eliquis,  appropriately dosed     on Flecainide and metoprolol, stable intervals     No AF burden by symptoms  Excellent exertional capacity, no symptoms to suggest CAD, or change in cardiac status  2. HTN     Not quite at goal     He has room for improvement in his diet in regards to salt reduction     Will monitor his BP at home, if not improved towards a goal of 130/70 or close, will let us know,  set up a virtual visit  Discussed healthy diet, low cholesterol, reducing sodium, he is quite motivated.  Disposition: F/u with [place] again in a year, sooner if needed  Current medicines are reviewed at length with the patient today.  The patient did not have any concerns regarding medicines.  Norma Fredrickson, PA-C 02/10/2019 10:12 AM     [facility] [address] [place] [address] [phone] (office)  [phone] (fax)

## 2019-02-09 ENCOUNTER — Encounter: Payer: Self-pay | Admitting: Family Medicine

## 2019-02-09 ENCOUNTER — Other Ambulatory Visit: Payer: Self-pay

## 2019-02-09 ENCOUNTER — Ambulatory Visit (INDEPENDENT_AMBULATORY_CARE_PROVIDER_SITE_OTHER): Payer: BC Managed Care – PPO | Admitting: Family Medicine

## 2019-02-09 VITALS — BP 158/78 | HR 60 | Temp 96.1°F | Ht 71.0 in | Wt 194.6 lb

## 2019-02-09 DIAGNOSIS — Z8739 Personal history of other diseases of the musculoskeletal system and connective tissue: Secondary | ICD-10-CM

## 2019-02-09 DIAGNOSIS — E559 Vitamin D deficiency, unspecified: Secondary | ICD-10-CM | POA: Diagnosis not present

## 2019-02-09 DIAGNOSIS — Z0000 Encounter for general adult medical examination without abnormal findings: Secondary | ICD-10-CM

## 2019-02-09 DIAGNOSIS — Q6672 Congenital pes cavus, left foot: Secondary | ICD-10-CM

## 2019-02-09 DIAGNOSIS — Q6671 Congenital pes cavus, right foot: Secondary | ICD-10-CM

## 2019-02-09 DIAGNOSIS — I10 Essential (primary) hypertension: Secondary | ICD-10-CM | POA: Diagnosis not present

## 2019-02-09 DIAGNOSIS — R351 Nocturia: Secondary | ICD-10-CM

## 2019-02-09 HISTORY — DX: Congenital pes cavus, right foot: Q66.71

## 2019-02-09 LAB — URINALYSIS, ROUTINE W REFLEX MICROSCOPIC
Bilirubin Urine: NEGATIVE
Hgb urine dipstick: NEGATIVE
Ketones, ur: NEGATIVE
Leukocytes,Ua: NEGATIVE
Nitrite: NEGATIVE
RBC / HPF: NONE SEEN (ref 0–?)
Specific Gravity, Urine: 1.025 (ref 1.000–1.030)
Total Protein, Urine: NEGATIVE
Urine Glucose: NEGATIVE
Urobilinogen, UA: 0.2 (ref 0.0–1.0)
WBC, UA: NONE SEEN (ref 0–?)
pH: 5.5 (ref 5.0–8.0)

## 2019-02-09 LAB — BASIC METABOLIC PANEL
BUN: 27 mg/dL — ABNORMAL HIGH (ref 6–23)
CO2: 27 mEq/L (ref 19–32)
Calcium: 8.8 mg/dL (ref 8.4–10.5)
Chloride: 103 mEq/L (ref 96–112)
Creatinine, Ser: 1.04 mg/dL (ref 0.40–1.50)
GFR: 71.68 mL/min (ref >60.00)
Glucose, Bld: 122 mg/dL — ABNORMAL HIGH (ref 70–99)
Potassium: 4.2 mEq/L (ref 3.5–5.1)
Sodium: 137 mEq/L (ref 135–145)

## 2019-02-09 LAB — PSA: PSA: 3.01 ng/mL (ref 0.10–4.00)

## 2019-02-09 LAB — LIPID PANEL
Cholesterol: 175 mg/dL (ref 0–200)
HDL: 36.3 mg/dL — ABNORMAL LOW (ref >39.00)
LDL Cholesterol: 110 mg/dL — ABNORMAL HIGH (ref 0–99)
NonHDL: 138.72
Total CHOL/HDL Ratio: 5
Triglycerides: 144 mg/dL (ref 0.0–149.0)
VLDL: 28.8 mg/dL (ref 0.0–40.0)

## 2019-02-09 LAB — CBC
HCT: 44.1 % (ref 39.0–52.0)
Hemoglobin: 15 g/dL (ref 13.0–17.0)
MCHC: 34 g/dL (ref 30.0–36.0)
MCV: 88.5 fl (ref 78.0–100.0)
Platelets: 148 10*3/uL — ABNORMAL LOW (ref 150.0–400.0)
RBC: 4.99 Mil/uL (ref 4.22–5.81)
RDW: 13 % (ref 11.5–15.5)
WBC: 5.8 10*3/uL (ref 4.0–10.5)

## 2019-02-09 LAB — HEPATIC FUNCTION PANEL
ALT: 27 U/L (ref 0–53)
AST: 23 U/L (ref 0–37)
Albumin: 4.2 g/dL (ref 3.5–5.2)
Alkaline Phosphatase: 55 U/L (ref 39–117)
Bilirubin, Direct: 0.1 mg/dL (ref 0.0–0.3)
Total Bilirubin: 0.4 mg/dL (ref 0.2–1.2)
Total Protein: 6.2 g/dL (ref 6.0–8.3)

## 2019-02-09 LAB — VITAMIN B12: Vitamin B-12: 332 pg/mL (ref 211–911)

## 2019-02-09 LAB — URIC ACID: Uric Acid, Serum: 7.3 mg/dL (ref 4.0–7.8)

## 2019-02-09 LAB — VITAMIN D 25 HYDROXY (VIT D DEFICIENCY, FRACTURES): VITD: 26.04 ng/mL — ABNORMAL LOW (ref 30.00–100.00)

## 2019-02-09 LAB — LDL CHOLESTEROL, DIRECT: Direct LDL: 107 mg/dL

## 2019-02-09 MED ORDER — VITAMIN D (ERGOCALCIFEROL) 1.25 MG (50000 UNIT) PO CAPS: 50000.0000 [IU] | ORAL_CAPSULE | ORAL | 5 refills | Status: AC

## 2019-02-09 NOTE — Progress Notes (Signed)
Established Patient Office Visit  Subjective:  Patient ID: Alvin Morrison, male    DOB: October 14, 1955  Age: 64 y.o. MRN: [id number]  CC:  Chief Complaint  Patient presents with  . Annual Exam    pt here for annual visit, states that his feet sometimes has a tingling feeling he would like to discuss. Would also like to satrt on Allopurinol.     HPI [name] presents for a complete physical.  He is doing well during the pandemic.  Reminds me that he is retired and spends a good part of his time taking care of his grandchildren.  Remains quite active walking up to 6 to 7 miles daily.  He has been experiencing some burning in the balls of his feet would like for me to take a look at that.  Urine flow is okay he does experience nocturia x2 nightly.  He also is concerned about gout returning the burning in his feet.  Denies swollen and red joints.  He does drink a couple beers daily.  He enjoys red meat.  Only drinks 1 cup of coffee daily.  Blood pressure is elevated today.  He is taking losartan metoprolol and flecainide for control of his blood pressure and atrial fib.  Atrial fib has been well controlled.  He does see the dentist twice yearly and the eye doctor as well.  Appointment with cardiology tomorrow.  Past Medical History:  Diagnosis Date  . Allergy   . AR (allergic rhinitis)   . Eczema   . Fracture of left humerus, medial epicondyle     med. epi. not specified   . GERD (gastroesophageal reflux disease)   . HTN (hypertension)   . Paroxysmal atrial fibrillation (HCC)    chads2vasc score is 1    Past Surgical History:  Procedure Laterality Date  . APPENDECTOMY    . CARDIOVERSION N/A 04/29/2012   Procedure: CARDIOVERSION;  Surgeon: Larey Dresser, MD;  Location: [place];  Service: Cardiovascular;  Laterality: N/A;  . COLONOSCOPY  01/26/05  . left arm sx     humerus sx plate and screws  . TEE WITHOUT CARDIOVERSION N/A 04/29/2012   Procedure: TRANSESOPHAGEAL  ECHOCARDIOGRAM (TEE);  Surgeon: Larey Dresser, MD;  [facility] ENDOSCOPY;  Service: Cardiovascular;  Laterality: N/A;    Family History  Problem Relation Age of Onset  . Heart disease Father   . Atrial fibrillation Son     Social History   Socioeconomic History  . Marital status: Married    Spouse name: Not on file  . Number of children: Not on file  . Years of education: Not on file  . Highest education level: Not on file  Occupational History  . Not on file  Tobacco Use  . Smoking status: Never Smoker  . Smokeless tobacco: Never Used  Substance and Sexual Activity  . Alcohol use: Yes    Alcohol/week: 14.0 standard drinks    Types: 14 Cans of beer per week    Comment: 2 beers daily  . Drug use: No  . Sexual activity: Yes  Other Topics Concern  . Not on file  Social History Narrative   Married; retired; single; gets regular exercise.   Social Determinants of Health   Financial Resource Strain:   . Difficulty of Paying Living Expenses: Not on file  Food Insecurity:   . Worried About Charity fundraiser in the Last Year: Not on file  . Ran Out of Food in  the Last Year: Not on file  Transportation Needs:   . Lack of Transportation (Medical): Not on file  . Lack of Transportation (Non-Medical): Not on file  Physical Activity:   . Days of Exercise per Week: Not on file  . Minutes of Exercise per Session: Not on file  Stress:   . Feeling of Stress : Not on file  Social Connections:   . Frequency of Communication with Friends and Family: Not on file  . Frequency of Social Gatherings with Friends and Family: Not on file  . Attends Religious Services: Not on file  . Active Member of Clubs or Organizations: Not on file  . Attends Banker Meetings: Not on file  . Marital Status: Not on file  Intimate Partner Violence:   . Fear of Current or Ex-Partner: Not on file  . Emotionally Abused: Not on file  . Physically Abused: Not on file  . Sexually Abused:  Not on file    Outpatient Medications Prior to Visit  Medication Sig Dispense Refill  . azelastine (ASTELIN) 0.1 % nasal spray Place 1 spray into both nostrils 2 (two) times daily. Use in each nostril as directed 30 mL 12  . calcium citrate-vitamin D (CITRACAL+D) 315-200 MG-UNIT per tablet Take 1 tablet by mouth daily.      . cetirizine (ZYRTEC) 10 MG tablet Take 10 mg by mouth daily.      [name] Kitchen ELIQUIS 5 MG TABS tablet TAKE 1 TABLET BY MOUTH 2 TIMES DAILY. 180 tablet 1  . flecainide (TAMBOCOR) 50 MG tablet TAKE 1 TABLET (50 MG TOTAL) BY MOUTH 2 TIMES DAILY. 180 tablet 3  . losartan (COZAAR) 100 MG tablet TAKE 1 TABLET BY MOUTH DAILY. 90 tablet 1  . metoprolol succinate (TOPROL-XL) 50 MG 24 hr tablet Take 1 tablet (50 mg total) by mouth daily. 90 tablet 3  . montelukast (SINGULAIR) 10 MG tablet TAKE 1 TABLET BY MOUTH AT BEDTIME. 90 tablet 1  . Multiple Vitamins-Minerals (ONE-A-DAY MENS 50+ ADVANTAGE PO) Take 1 capsule by mouth daily.    [name] Kitchen omeprazole (PRILOSEC) 20 MG capsule TAKE 1 CAPSULE BY MOUTH DAILY AS NEEDED (HEARTBURN OR ACID REFLUX). 90 capsule 1  . zinc gluconate 50 MG tablet Take 50 mg by mouth daily.       No facility-administered medications prior to visit.    Allergies  Allergen Reactions  . Hydrochlorothiazide Hives and Itching    ROS Review of Systems  Constitutional: Negative.   HENT: Negative.   Eyes: Negative for photophobia and visual disturbance.  Respiratory: Negative.  Negative for chest tightness and shortness of breath.   Cardiovascular: Negative.  Negative for chest pain and palpitations.  Gastrointestinal: Negative.   Endocrine: Negative for polyphagia.  Genitourinary: Negative for difficulty urinating, frequency and urgency.  Musculoskeletal: Negative for gait problem and joint swelling.  Skin: Negative for wound.  Allergic/Immunologic: Negative for immunocompromised state.  Neurological: Negative for speech difficulty.  Hematological: Does not  bruise/bleed easily.  Psychiatric/Behavioral: Negative.    Depression screen [facility] 2/9 02/09/2019 02/09/2019  Decreased Interest 0 0  Down, Depressed, Hopeless 0 0  PHQ - 2 Score 0 0  Altered sleeping 0 -  Tired, decreased energy 0 -  Change in appetite 0 -  Feeling bad or failure about yourself  0 -  Trouble concentrating 0 -  Moving slowly or fidgety/restless 0 -  Suicidal thoughts 0 -  PHQ-9 Score 0 -      Objective:    Physical  Exam  Constitutional: He is oriented to person, place, and time. He appears well-developed and well-nourished. No distress.  HENT:  Head: Normocephalic and atraumatic.  Right Ear: External ear normal.  Left Ear: External ear normal.  Eyes: Conjunctivae are normal. Right eye exhibits no discharge. Left eye exhibits no discharge. No scleral icterus.  Neck: No JVD present. No tracheal deviation present. No thyromegaly present.  Cardiovascular: Normal rate, regular rhythm and normal heart sounds.  Pulses:      Dorsalis pedis pulses are 2+ on the right side and 2+ on the left side.       Posterior tibial pulses are 2+ on the right side and 2+ on the left side.  Pulmonary/Chest: Effort normal and breath sounds normal. No stridor.  Abdominal: Bowel sounds are normal. He exhibits no distension. There is no abdominal tenderness. There is no rebound and no guarding.  Genitourinary: Rectum:     Guaiac result negative.     No rectal mass, anal fissure, tenderness, external hemorrhoid, internal hemorrhoid or abnormal anal tone.  Prostate is enlarged. Prostate is not tender.  Musculoskeletal:        General: No edema.     Cervical back: Neck supple.       Feet:  Lymphadenopathy:    He has no cervical adenopathy.  Neurological: He is alert and oriented to person, place, and time.  Skin: Skin is warm and dry. He is not diaphoretic.  Psychiatric: He has a normal mood and affect. His behavior is normal.    BP (!) 158/78   Pulse 60   Temp (!) 96.1 F (35.6 C)  (Tympanic)   Ht 5\' 11"  (1.803 m)   Wt 194 lb 9.6 oz (88.3 kg)   SpO2 98%   BMI 27.14 kg/m  Wt Readings from Last 3 Encounters:  02/09/19 194 lb 9.6 oz (88.3 kg)  01/24/18 195 lb (88.5 kg)  01/23/18 195 lb (88.5 kg)     Health Maintenance Due  Topic Date Due  . Hepatitis C Screening  04-Oct-1955  . HIV Screening  02/22/1969    There are no preventive care reminders to display for this patient.  Lab Results  Component Value Date   TSH 1.33 01/22/2017   Lab Results  Component Value Date   WBC 7.0 01/24/2018   HGB 15.1 01/24/2018   HCT 44.6 01/24/2018   MCV 88.6 01/24/2018   PLT 144.0 (L) 01/24/2018   Lab Results  Component Value Date   NA 138 01/24/2018   K 4.4 01/24/2018   CO2 29 01/24/2018   GLUCOSE 107 (H) 01/24/2018   BUN 20 01/24/2018   CREATININE 1.10 01/24/2018   BILITOT 0.8 01/24/2018   ALKPHOS 52 01/24/2018   AST 22 01/24/2018   ALT 26 01/24/2018   PROT 6.2 01/24/2018   ALBUMIN 4.1 01/24/2018   CALCIUM 9.3 01/24/2018   GFR 71.65 01/24/2018   Lab Results  Component Value Date   CHOL 170 01/24/2018   Lab Results  Component Value Date   HDL 40.00 01/24/2018   Lab Results  Component Value Date   LDLCALC 114 (H) 01/24/2018   Lab Results  Component Value Date   TRIG 77.0 01/24/2018   Lab Results  Component Value Date   CHOLHDL 4 01/24/2018   Lab Results  Component Value Date   HGBA1C 5.7 11/30/2014      Assessment & Plan:   Problem List Items Addressed This Visit      Cardiovascular and Mediastinum  Essential hypertension - Primary   Relevant Orders   CBC   Basic metabolic panel   Urinalysis, Routine w reflex microscopic     Other   Healthcare maintenance   Relevant Orders   Hepatic function panel   LDL cholesterol, direct   PSA   Vitamin B12   Lipid panel   Congenital cavus deformity of both feet   Nocturia   Relevant Orders   Urinalysis, Routine w reflex microscopic   PSA    Other Visit Diagnoses    Vitamin D  deficiency       Relevant Orders   VITAMIN D 25 Hydroxy (Vit-D Deficiency, Fractures)   History of gout       Relevant Orders   Uric acid      No orders of the defined types were placed in this encounter.   Follow-up: Return in about 6 months (around 08/09/2019).  Reminded the patient that alcohol can raise his blood pressure and it is a cardiac irritant.  He will pursue orthotics perhaps at the [employer] [facility] and return if that does not help the burning in his feet.  TSH checked recently B12 level is pending today.  Will see cardiology tomorrow and they will adjust meds if his blood pressure remains elevated.  Mliss Sax, MD

## 2019-02-09 NOTE — Patient Instructions (Signed)
Health Maintenance, Male Adopting a healthy lifestyle and getting preventive care are important in promoting health and wellness. Ask your health care provider about:  The right schedule for you to have regular tests and exams.  Things you can do on your own to prevent diseases and keep yourself healthy. What should I know about diet, weight, and exercise? Eat a healthy diet   Eat a diet that includes plenty of vegetables, fruits, low-fat dairy products, and lean protein.  Do not eat a lot of foods that are high in solid fats, added sugars, or sodium. Maintain a healthy weight Body mass index (BMI) is a measurement that can be used to identify possible weight problems. It estimates body fat based on height and weight. Your health care provider can help determine your BMI and help you achieve or maintain a healthy weight. Get regular exercise Get regular exercise. This is one of the most important things you can do for your health. Most adults should:  Exercise for at least 150 minutes each week. The exercise should increase your heart rate and make you sweat (moderate-intensity exercise).  Do strengthening exercises at least twice a week. This is in addition to the moderate-intensity exercise.  Spend less time sitting. Even light physical activity can be beneficial. Watch cholesterol and blood lipids Have your blood tested for lipids and cholesterol at 65 years of age, then have this test every 5 years. You may need to have your cholesterol levels checked more often if:  Your lipid or cholesterol levels are high.  You are older than 65 years of age.  You are at high risk for heart disease. What should I know about cancer screening? Many types of cancers can be detected early and may often be prevented. Depending on your health history and family history, you may need to have cancer screening at various ages. This may include screening for:  Colorectal cancer.  Prostate  cancer.  Skin cancer.  Lung cancer. What should I know about heart disease, diabetes, and high blood pressure? Blood pressure and heart disease  High blood pressure causes heart disease and increases the risk of stroke. This is more likely to develop in people who have high blood pressure readings, are of African descent, or are overweight.  Talk with your health care provider about your target blood pressure readings.  Have your blood pressure checked: ? Every 3-5 years if you are 65-39 years of age. ? Every year if you are 40 years old or older.  If you are between the ages of 65 and 65 and are a current or former smoker, ask your health care provider if you should have a one-time screening for abdominal aortic aneurysm (AAA). Diabetes Have regular diabetes screenings. This checks your fasting blood sugar level. Have the screening done:  Once every three years after age 45 if you are at a normal weight and have a low risk for diabetes.  More often and at a younger age if you are overweight or have a high risk for diabetes. What should I know about preventing infection? Hepatitis B If you have a higher risk for hepatitis B, you should be screened for this virus. Talk with your health care provider to find out if you are at risk for hepatitis B infection. Hepatitis C Blood testing is recommended for:  Everyone born from 1945 through 1965.  Anyone with known risk factors for hepatitis C. Sexually transmitted infections (STIs)  You should be screened each year   for STIs, including gonorrhea and chlamydia, if: ? You are sexually active and are younger than 65 years of age. ? You are older than 65 years of age and your health care provider tells you that you are at risk for this type of infection. ? Your sexual activity has changed since you were last screened, and you are at increased risk for chlamydia or gonorrhea. Ask your health care provider if you are at risk.  Ask your  health care provider about whether you are at high risk for HIV. Your health care provider may recommend a prescription medicine to help prevent HIV infection. If you choose to take medicine to prevent HIV, you should first get tested for HIV. You should then be tested every 3 months for as long as you are taking the medicine. Follow these instructions at home: Lifestyle  Do not use any products that contain nicotine or tobacco, such as cigarettes, e-cigarettes, and chewing tobacco. If you need help quitting, ask your health care provider.  Do not use street drugs.  Do not share needles.  Ask your health care provider for help if you need support or information about quitting drugs. Alcohol use  Do not drink alcohol if your health care provider tells you not to drink.  If you drink alcohol: ? Limit how much you have to 0-2 drinks a day. ? Be aware of how much alcohol is in your drink. In the [place] one drink equals one 12 oz bottle of beer (355 mL), one 5 oz glass of wine (148 mL), or one 1 oz glass of hard liquor (44 mL). General instructions  Schedule regular health, dental, and eye exams.  Stay current with your vaccines.  Tell your health care provider if: ? You often feel depressed. ? You have ever been abused or do not feel safe at home. Summary  Adopting a healthy lifestyle and getting preventive care are important in promoting health and wellness.  Follow your health care provider's instructions about healthy diet, exercising, and getting tested or screened for diseases.  Follow your health care provider's instructions on monitoring your cholesterol and blood pressure. This information is not intended to replace advice given to you by your health care provider. Make sure you discuss any questions you have with your health care provider. Document Revised: 12/25/2017 Document Reviewed: 12/25/2017 Elsevier Patient Education  2020 Elsevier Inc.  Preventive Care 65-65 Years  Old, Male Preventive care refers to lifestyle choices and visits with your health care provider that can promote health and wellness. This includes:  A yearly physical exam. This is also called an annual well check.  Regular dental and eye exams.  Immunizations.  Screening for certain conditions.  Healthy lifestyle choices, such as eating a healthy diet, getting regular exercise, not using drugs or products that contain nicotine and tobacco, and limiting alcohol use. What can I expect for my preventive care visit? Physical exam Your health care provider will check:  Height and weight. These may be used to calculate body mass index (BMI), which is a measurement that tells if you are at a healthy weight.  Heart rate and blood pressure.  Your skin for abnormal spots. Counseling Your health care provider may ask you questions about:  Alcohol, tobacco, and drug use.  Emotional well-being.  Home and relationship well-being.  Sexual activity.  Eating habits.  Work and work environment. What immunizations do I need?  Influenza (flu) vaccine  This is recommended every year. Tetanus, diphtheria,   and pertussis (Tdap) vaccine  You may need a Td booster every 10 years. Varicella (chickenpox) vaccine  You may need this vaccine if you have not already been vaccinated. Zoster (shingles) vaccine  You may need this after age 63. Measles, mumps, and rubella (MMR) vaccine  You may need at least one dose of MMR if you were born in 1957 or later. You may also need a second dose. Pneumococcal conjugate (PCV13) vaccine  You may need this if you have certain conditions and were not previously vaccinated. Pneumococcal polysaccharide (PPSV23) vaccine  You may need one or two doses if you smoke cigarettes or if you have certain conditions. Meningococcal conjugate (MenACWY) vaccine  You may need this if you have certain conditions. Hepatitis A vaccine  You may need this if you have  certain conditions or if you travel or work in places where you may be exposed to hepatitis A. Hepatitis B vaccine  You may need this if you have certain conditions or if you travel or work in places where you may be exposed to hepatitis B. Haemophilus influenzae type b (Hib) vaccine  You may need this if you have certain risk factors. Human papillomavirus (HPV) vaccine  If recommended by your health care provider, you may need three doses over 6 months. You may receive vaccines as individual doses or as more than one vaccine together in one shot (combination vaccines). Talk with your health care provider about the risks and benefits of combination vaccines. What tests do I need? Blood tests  Lipid and cholesterol levels. These may be checked every 5 years, or more frequently if you are over 68 years old.  Hepatitis C test.  Hepatitis B test. Screening  Lung cancer screening. You may have this screening every year starting at age 78 if you have a 30-pack-year history of smoking and currently smoke or have quit within the past 15 years.  Prostate cancer screening. Recommendations will vary depending on your family history and other risks.  Colorectal cancer screening. All adults should have this screening starting at age 38 and continuing until age 22. Your health care provider may recommend screening at age 73 if you are at increased risk. You will have tests every 1-10 years, depending on your results and the type of screening test.  Diabetes screening. This is done by checking your blood sugar (glucose) after you have not eaten for a while (fasting). You may have this done every 1-3 years.  Sexually transmitted disease (STD) testing. Follow these instructions at home: Eating and drinking  Eat a diet that includes fresh fruits and vegetables, whole grains, lean protein, and low-fat dairy products.  Take vitamin and mineral supplements as recommended by your health care  provider.  Do not drink alcohol if your health care provider tells you not to drink.  If you drink alcohol: ? Limit how much you have to 0-2 drinks a day. ? Be aware of how much alcohol is in your drink. In the [place] one drink equals one 12 oz bottle of beer (355 mL), one 5 oz glass of wine (148 mL), or one 1 oz glass of hard liquor (44 mL). Lifestyle  Take daily care of your teeth and gums.  Stay active. Exercise for at least 30 minutes on 5 or more days each week.  Do not use any products that contain nicotine or tobacco, such as cigarettes, e-cigarettes, and chewing tobacco. If you need help quitting, ask your health care provider.  If  you are sexually active, practice safe sex. Use a condom or other form of protection to prevent STIs (sexually transmitted infections).  Talk with your health care provider about taking a low-dose aspirin every day starting at age 50. What's next?  Go to your health care provider once a year for a well check visit.  Ask your health care provider how often you should have your eyes and teeth checked.  Stay up to date on all vaccines. This information is not intended to replace advice given to you by your health care provider. Make sure you discuss any questions you have with your health care provider. Document Revised: 12/26/2017 Document Reviewed: 12/26/2017 Elsevier Patient Education  2020 Elsevier Inc.  High Arch Feet  A high arch foot is a condition in which the arch of the foot does not touch the ground when you are standing or walking. As a result, more weight is put on the parts of the foot that touch the ground, including the front, outer side, and heel of the foot. The medical term for this condition is cavus foot. It can affect one foot or both feet. Having high arches can be painful and unstable. What are the causes? This condition may be caused by:  Being born with a high arch in one foot or both feet.  Being born with a foot that  turns inward (club foot).  Having a broken foot that does not heal properly.  Having a nervous system (neurological) condition such as stroke, polio, cerebral palsy, or muscular dystrophy. In this case, high arches tend to get worse over time. Sometimes the cause of high arch foot is not known. What are the signs or symptoms? This condition can range from mild to severe. People with mild high arches may have few symptoms. More severe high arches may cause:  An unstable ankle with frequent ankle sprains or strains.  Pain when walking or standing.  Calluses on the front, side, and heel of the foot.  Toe deformities.  The foot to drag (foot drop). This can happen if the high arch is caused by a neurological condition, which can cause the muscles that lift the foot to become weak. How is this diagnosed? This condition is diagnosed based on:  Your medical and family history.  A physical exam. Your health care provider will examine your feet and observe your feet while you walk and stand. Your provider may test your foot strength and movement. X-rays or other imaging tests may be done to learn more about your condition. If your health care provider suspects that you have a neurological condition, you may need to see a provider who specializes in the nervous system (neurologist). How is this treated? The goal of treatment is to prevent pain and stabilize the foot. First treatments may include:  A shoe insert to support and cushion the foot (orthotic device).  Supportive shoes with high ankle support and a wide base.  A brace to support a weak or unstable ankle.  Physical therapy to strengthen weak muscles and loosen tight muscles. If these treatments do not help, you may need surgery. This is more likely when high arches are caused by a neurological condition that is getting worse. Follow these instructions at home: General instructions  Take over-the-counter and prescription  medicines only as told by your health care provider.  Use a shoe insert or supportive shoes as told by your health care provider.  Return to your normal activities as told by   your health care provider. Ask your health care provider what activities are safe for you.  Keep all follow-up visits as told by your health care provider. This is important. If you have a brace:  Wear the brace as told by your health care provider. Remove it only as told by your health care provider.  Loosen the brace if your toes tingle, become numb, or turn cold and blue.  Keep the brace clean.  Do not let the brace get wet if it is not waterproof.  Ask your health care provider when it is safe to drive if you have a brace on your foot. Contact a health care provider if:  You continue to have pain when walking or standing.  Your foot or ankle feels weak and unstable.  You sprain your ankle. Summary  A high arch foot is a condition in which the arch of the foot does not touch the ground when you are standing or walking.  A high arch foot can be painful and unstable.  This condition is diagnosed with a physical exam and other tests.  Treatment may include shoe inserts, special shoes, bracing, physical therapy, or surgery. This information is not intended to replace advice given to you by your health care provider. Make sure you discuss any questions you have with your health care provider. Document Revised: 04/24/2018 Document Reviewed: 03/06/2017 Elsevier Patient Education  2020 Elsevier Inc.  

## 2019-02-09 NOTE — Addendum Note (Signed)
Addended by: Andrez Grime on: 02/09/2019 03:09 PM   Modules accepted: Orders

## 2019-02-10 ENCOUNTER — Ambulatory Visit: Payer: BC Managed Care – PPO | Admitting: Physician Assistant

## 2019-02-10 VITALS — BP 148/82 | HR 54 | Ht 71.75 in | Wt 195.0 lb

## 2019-02-10 DIAGNOSIS — I10 Essential (primary) hypertension: Secondary | ICD-10-CM | POA: Diagnosis not present

## 2019-02-10 DIAGNOSIS — Z79899 Other long term (current) drug therapy: Secondary | ICD-10-CM | POA: Diagnosis not present

## 2019-02-10 DIAGNOSIS — I480 Paroxysmal atrial fibrillation: Secondary | ICD-10-CM

## 2019-02-10 MED ORDER — LOSARTAN POTASSIUM 100 MG PO TABS: 100.0000 mg | ORAL_TABLET | Freq: Every day | ORAL | 3 refills | Status: DC

## 2019-02-10 MED ORDER — METOPROLOL SUCCINATE ER 50 MG PO TB24: 50.0000 mg | ORAL_TABLET | Freq: Every day | ORAL | 3 refills | Status: DC

## 2019-02-10 MED ORDER — FLECAINIDE ACETATE 50 MG PO TABS: ORAL_TABLET | ORAL | 3 refills | Status: DC

## 2019-02-10 NOTE — Patient Instructions (Signed)
Medication Instructions:    Your physician recommends that you continue on your current medications as directed. Please refer to the Current Medication list given to you today.  *If you need a refill on your cardiac medications before your next appointment, please call your pharmacy*  Lab Work: [place]   If you have labs (blood work) drawn today and your tests are completely normal, you will receive your results only by: [name] Kitchen MyChart Message (if you have MyChart) OR . A paper copy in the mail If you have any lab test that is abnormal or we need to change your treatment, we will call you to review the results.  Testing/Procedures: NONE ORDERED  TODAY   Follow-Up: At [facility], you and your health needs are our priority.  As part of our continuing mission to provide you with exceptional heart care, we have created designated Provider Care Teams.  These Care Teams include your primary Cardiologist (physician) and Advanced Practice Providers (APPs -  Physician Assistants and Nurse Practitioners) who all work together to provide you with the care you need, when you need it.  Your next appointment:   1 year(s)  The format for your next appointment:   In Person  Provider:   You may see Allred. or one of the following Advanced Practice Providers on your designated Care Team:    Chanetta Marshall, NP  Tommye Standard, PA-C  Legrand Como "Jonni Sanger" Chalmers Cater, [place]   Other Instructions  Healthy Eating Following a healthy eating pattern may help you to achieve and maintain a healthy body weight, reduce the risk of chronic disease, and live a long and productive life. It is important to follow a healthy eating pattern at an appropriate calorie level for your body. Your nutritional needs should be met primarily through food by choosing a variety of nutrient-rich foods. What are tips for following this plan? Reading food labels  Read labels and choose the following: ? Reduced or low  sodium. ? Juices with 100% fruit juice. ? Foods with low saturated fats and high polyunsaturated and monounsaturated fats. ? Foods with whole grains, such as whole wheat, cracked wheat, brown rice, and wild rice. ? Whole grains that are fortified with folic acid. This is recommended for women who are pregnant or who want to become pregnant.  Read labels and avoid the following: ? Foods with a lot of added sugars. These include foods that contain brown sugar, corn sweetener, corn syrup, dextrose, fructose, glucose, high-fructose corn syrup, honey, invert sugar, lactose, malt syrup, maltose, molasses, raw sugar, sucrose, trehalose, or turbinado sugar.  Do not eat more than the following amounts of added sugar per day:  6 teaspoons (25 g) for women.  9 teaspoons (38 g) for men. ? Foods that contain processed or refined starches and grains. ? Refined grain products, such as white flour, degermed cornmeal, white bread, and white rice. Shopping  Choose nutrient-rich snacks, such as vegetables, whole fruits, and nuts. Avoid high-calorie and high-sugar snacks, such as potato chips, fruit snacks, and candy.  Use oil-based dressings and spreads on foods instead of solid fats such as butter, stick margarine, or cream cheese.  Limit pre-made sauces, mixes, and "instant" products such as flavored rice, instant noodles, and ready-made pasta.  Try more plant-protein sources, such as tofu, tempeh, black beans, edamame, lentils, nuts, and seeds.  Explore eating plans such as the Mediterranean diet or vegetarian diet. Cooking  Use oil to saut or stir-fry foods instead of solid fats  such as butter, stick margarine, or lard.  Try baking, boiling, grilling, or broiling instead of frying.  Remove the fatty part of meats before cooking.  Steam vegetables in water or broth. Meal planning   At meals, imagine dividing your plate into fourths: ? One-half of your plate is fruits and  vegetables. ? One-fourth of your plate is whole grains. ? One-fourth of your plate is protein, especially lean meats, poultry, eggs, tofu, beans, or nuts.  Include low-fat dairy as part of your daily diet. Lifestyle  Choose healthy options in all settings, including home, work, school, restaurants, or stores.  Prepare your food safely: ? Wash your hands after handling raw meats. ? Keep food preparation surfaces clean by regularly washing with hot, soapy water. ? Keep raw meats separate from ready-to-eat foods, such as fruits and vegetables. ? Cook seafood, meat, poultry, and eggs to the recommended internal temperature. ? Store foods at safe temperatures. In general:  Keep cold foods at 57F (4.4C) or below.  Keep hot foods at 157F (60C) or above.  Keep your freezer at [facility] (-17.8C) or below.  Foods are no longer safe to eat when they have been between the temperatures of 40-157F (4.4-60C) for more than 2 hours. What foods should I eat? Fruits Aim to eat 2 cup-equivalents of fresh, canned (in natural juice), or frozen fruits each day. Examples of 1 cup-equivalent of fruit include 1 small apple, 8 large strawberries, 1 cup canned fruit,  cup dried fruit, or 1 cup 100% juice. Vegetables Aim to eat 2-3 cup-equivalents of fresh and frozen vegetables each day, including different varieties and colors. Examples of 1 cup-equivalent of vegetables include 2 medium carrots, 2 cups raw, leafy greens, 1 cup chopped vegetable (raw or cooked), or 1 medium baked potato. Grains Aim to eat 6 ounce-equivalents of whole grains each day. Examples of 1 ounce-equivalent of grains include 1 slice of bread, 1 cup ready-to-eat cereal, 3 cups popcorn, or  cup cooked rice, pasta, or cereal. Meats and other proteins Aim to eat 5-6 ounce-equivalents of protein each day. Examples of 1 ounce-equivalent of protein include 1 egg, 1/2 cup nuts or seeds, or 1 tablespoon (16 g) peanut butter. A cut of meat or  fish that is the size of a deck of cards is about 3-4 ounce-equivalents.  Of the protein you eat each week, try to have at least 8 ounces come from seafood. This includes salmon, trout, herring, and anchovies. Dairy Aim to eat 3 cup-equivalents of fat-free or low-fat dairy each day. Examples of 1 cup-equivalent of dairy include 1 cup (240 mL) milk, 8 ounces (250 g) yogurt, 1 ounces (44 g) natural cheese, or 1 cup (240 mL) fortified soy milk. Fats and oils  Aim for about 5 teaspoons (21 g) per day. Choose monounsaturated fats, such as canola and olive oils, avocados, peanut butter, and most nuts, or polyunsaturated fats, such as sunflower, corn, and soybean oils, walnuts, pine nuts, sesame seeds, sunflower seeds, and flaxseed. Beverages  Aim for six 8-oz glasses of water per day. Limit coffee to three to five 8-oz cups per day.  Limit caffeinated beverages that have added calories, such as soda and energy drinks.  Limit alcohol intake to no more than 1 drink a day for nonpregnant women and 2 drinks a day for men. One drink equals 12 oz of beer (355 mL), 5 oz of wine (148 mL), or 1 oz of hard liquor (44 mL). Seasoning and other foods  Avoid adding  excess amounts of salt to your foods. Try flavoring foods with herbs and spices instead of salt.  Avoid adding sugar to foods.  Try using oil-based dressings, sauces, and spreads instead of solid fats. This information is based on general [place] nutrition guidelines. For more information, visit [employer]. Exact amounts may vary based on your nutrition needs. Summary  A healthy eating plan may help you to maintain a healthy weight, reduce the risk of chronic diseases, and stay active throughout your life.  Plan your meals. Make sure you eat the right portions of a variety of nutrient-rich foods.  Try baking, boiling, grilling, or broiling instead of frying.  Choose healthy options in all settings, including home, work, school,  restaurants, or stores. This information is not intended to replace advice given to you by your health care provider. Make sure you discuss any questions you have with your health care provider. Document Revised: 04/15/2017 Document Reviewed: 04/15/2017 Elsevier Patient Education  [place].

## 2019-03-12 ENCOUNTER — Other Ambulatory Visit: Payer: Self-pay | Admitting: Family Medicine

## 2019-03-12 ENCOUNTER — Telehealth: Payer: Self-pay | Admitting: *Deleted

## 2019-03-12 NOTE — Telephone Encounter (Signed)
Scheduled 1st Covid 19 vaccine for 03/16/19@10 :15a at [facility].

## 2019-03-16 ENCOUNTER — Ambulatory Visit: Payer: Medicare PPO | Attending: Internal Medicine

## 2019-03-16 DIAGNOSIS — Z23 Encounter for immunization: Secondary | ICD-10-CM

## 2019-03-16 NOTE — Progress Notes (Signed)
   Covid-19 Vaccination Clinic  Name:  Alvin Morrison    MRN: [id number] DOB: Dec 26, 1955  03/16/2019  [name] was observed post Covid-19 immunization for 15 minutes without incidence. He was provided with Vaccine Information Sheet and instruction to access the V-Safe system.   [name] was instructed to call 911 with any severe reactions post vaccine: [name] Kitchen Difficulty breathing  . Swelling of your face and throat  . A fast heartbeat  . A bad rash all over your body  . Dizziness and weakness    Immunizations Administered    Name Date Dose VIS Date Route   Pfizer COVID-19 Vaccine 03/16/2019 10:11 AM 0.3 mL 12/26/2018 Intramuscular   Manufacturer: [employer], [employer]   Lot: [id number]   NDC: [address]-9

## 2019-03-19 ENCOUNTER — Other Ambulatory Visit: Payer: Self-pay | Admitting: Family Medicine

## 2019-04-13 ENCOUNTER — Other Ambulatory Visit: Payer: Self-pay | Admitting: Family Medicine

## 2019-04-13 DIAGNOSIS — J301 Allergic rhinitis due to pollen: Secondary | ICD-10-CM

## 2019-04-13 NOTE — Telephone Encounter (Signed)
Last OV 02/09/19 Last fill 10/07/18  #90/1

## 2019-04-14 ENCOUNTER — Ambulatory Visit: Payer: Medicare PPO | Attending: Internal Medicine

## 2019-04-14 DIAGNOSIS — Z23 Encounter for immunization: Secondary | ICD-10-CM

## 2019-04-14 NOTE — Progress Notes (Signed)
   Covid-19 Vaccination Clinic  Name:  ODEN LINDAMAN    MRN: [id number] DOB: 15-Dec-1955  04/14/2019  Mr. Barthold was observed post Covid-19 immunization for 15 minutes without incident. He was provided with Vaccine Information Sheet and instruction to access the V-Safe system.   Mr. Fetterman was instructed to call 911 with any severe reactions post vaccine: [name] Kitchen Difficulty breathing  . Swelling of face and throat  . A fast heartbeat  . A bad rash all over body  . Dizziness and weakness   Immunizations Administered    Name Date Dose VIS Date Route   Pfizer COVID-19 Vaccine 04/14/2019 10:35 AM 0.3 mL 12/26/2018 Intramuscular   Manufacturer: [employer], [employer]   Lot: [id number]   NDC: [address]-9

## 2019-05-04 NOTE — Progress Notes (Signed)
Nurse connected with patient 05/06/19 at  9:30 AM EDT by a telephone enabled telemedicine application and verified that I am speaking with the correct person using two identifiers. Patient stated full name and DOB. Patient gave permission to continue with virtual visit. Patient's location was at home and Nurse's location was at [place] office.   Subjective:   Alvin Morrison is a 64 y.o. male who presents for an Initial Medicare Annual Wellness Visit.  Review of Systems  Home Safety/Smoke Alarms: Feels safe in home. Smoke alarms in place.  Lives w/ wife in 2 story home.   Male:   CCS-  06/10/15.  Recall 10 yrs PSA-  Lab Results  Component Value Date   PSA 3.01 02/09/2019   PSA 3.31 01/24/2018   PSA 3.23 01/22/2017      Objective:     Advanced Directives 05/06/2019 04/29/2012  Does Patient Have a Medical Advance Directive? No Patient does not have advance directive  Would patient like information on creating a medical advance directive? No - Patient declined -    Current Medications (verified) Outpatient Encounter Medications as of 05/06/2019  Medication Sig  . calcium citrate-vitamin D (CITRACAL+D) 315-200 MG-UNIT per tablet Take 1 tablet by mouth daily.    . cetirizine (ZYRTEC) 10 MG tablet Take 10 mg by mouth.   Everlene Balls 5 MG TABS tablet TAKE 1 TABLET BY MOUTH 2 TIMES DAILY.  . flecainide (TAMBOCOR) 50 MG tablet TAKE 1 TABLET (50 MG TOTAL) BY MOUTH 2 TIMES DAILY.  [name] Kitchen losartan (COZAAR) 100 MG tablet Take 1 tablet (100 mg total) by mouth daily.  . metoprolol succinate (TOPROL-XL) 50 MG 24 hr tablet Take 1 tablet (50 mg total) by mouth daily.  . montelukast (SINGULAIR) 10 MG tablet TAKE 1 TABLET BY MOUTH AT BEDTIME.  . Multiple Vitamins-Minerals (ONE-A-DAY MENS 50+ ADVANTAGE PO) Take 1 capsule by mouth daily.  [name] Kitchen omeprazole (PRILOSEC) 20 MG capsule TAKE 1 CAPSULE BY MOUTH DAILY AS NEEDED (HEARTBURN OR ACID REFLUX).  . Vitamin D, Ergocalciferol, (DRISDOL) 1.25 MG (50000 UNIT)  CAPS capsule Take 1 capsule (50,000 Units total) by mouth every 7 (seven) days.  [name] Kitchen zinc gluconate 50 MG tablet Take 50 mg by mouth daily.    [name] Kitchen azelastine (ASTELIN) 0.1 % nasal spray PLACE 1 SPRAY INTO BOTH NOSTRILS 2 TIMES DAILY AS DIRECTED (Patient not taking: Reported on 05/06/2019)   No facility-administered encounter medications on file as of 05/06/2019.    Allergies (verified) Hydrochlorothiazide   History: Past Medical History:  Diagnosis Date  . Allergy   . AR (allergic rhinitis)   . Eczema   . Fracture of left humerus, medial epicondyle     med. epi. not specified   . GERD (gastroesophageal reflux disease)   . HTN (hypertension)   . Paroxysmal atrial fibrillation (HCC)    chads2vasc score is 1   Past Surgical History:  Procedure Laterality Date  . APPENDECTOMY    . CARDIOVERSION N/A 04/29/2012   Procedure: CARDIOVERSION;  Surgeon: Laurey Morale, MD;  Location[facility] ENDOSCOPY;  Service: Cardiovascular;  Laterality: N/A;  . COLONOSCOPY  01/26/05  . left arm sx     humerus sx plate and screws  . TEE WITHOUT CARDIOVERSION N/A 04/29/2012   Procedure: TRANSESOPHAGEAL ECHOCARDIOGRAM (TEE);  Surgeon: Laurey Morale, MD;  Location: [facility] ENDOSCOPY;  Service: Cardiovascular;  Laterality: N/A;   Family History  Problem Relation Age of Onset  . Heart disease Father   . Atrial fibrillation Son  Social History   Socioeconomic History  . Marital status: Married    Spouse name: Not on file  . Number of children: Not on file  . Years of education: Not on file  . Highest education level: Not on file  Occupational History  . Not on file  Tobacco Use  . Smoking status: Never Smoker  . Smokeless tobacco: Never Used  Substance and Sexual Activity  . Alcohol use: Yes    Alcohol/week: 14.0 standard drinks    Types: 14 Cans of beer per week    Comment: 2 beers daily  . Drug use: No  . Sexual activity: Yes  Other Topics Concern  . Not on file  Social History Narrative    Married; retired; single; gets regular exercise.   Social Determinants of Health   Financial Resource Strain: Low Risk   . Difficulty of Paying Living Expenses: Not hard at all  Food Insecurity: No Food Insecurity  . Worried About Programme researcher, broadcasting/film/video in the Last Year: Never true  . Ran Out of Food in the Last Year: Never true  Transportation Needs: No Transportation Needs  . Lack of Transportation (Medical): No  . Lack of Transportation (Non-Medical): No  Physical Activity:   . Days of Exercise per Week:   . Minutes of Exercise per Session:   Stress:   . Feeling of Stress :   Social Connections:   . Frequency of Communication with Friends and Family:   . Frequency of Social Gatherings with Friends and Family:   . Attends Religious Services:   . Active Member of Clubs or Organizations:   . Attends Banker Meetings:   [name] Kitchen Marital Status:    Tobacco Counseling Counseling given: Not Answered   Clinical Intake: Pain : No/denies pain      Activities of Daily Living In your present state of health, do you have any difficulty performing the following activities: 05/06/2019  Hearing? N  Vision? N  Difficulty concentrating or making decisions? N  Walking or climbing stairs? N  Dressing or bathing? N  Doing errands, shopping? N  Preparing Food and eating ? N  Using the Toilet? N  In the past six months, have you accidently leaked urine? N  Do you have problems with loss of bowel control? N  Managing your Medications? N  Managing your Finances? N  Housekeeping or managing your Housekeeping? N  Some recent data might be hidden     Immunizations and Health Maintenance Immunization History  Administered Date(s) Administered  . Influenza Split 10/02/2010, 10/24/2011  . Influenza,inj,Quad PF,6+ Mos 10/28/2012, 11/03/2013, 11/30/2014, 11/02/2016  . Influenza-Unspecified 10/24/2015, 11/21/2017, 11/18/2018  . PFIZER SARS-COV-2 Vaccination 03/16/2019, 04/14/2019  . Td  01/16/1999, 09/15/2009  . Zoster 11/03/2013  . Zoster Recombinat (Shingrix) 02/05/2019   Health Maintenance Due  Topic Date Due  . Hepatitis C Screening  Never done  . HIV Screening  Never done  . PNA vac Low Risk Adult (1 of 2 - PCV13) Never done    Patient Care Team: Mliss Sax, MD as PCP - General (Family Medicine) Hillis Range, MD as Consulting Physician (Cardiology)  Indicate any recent Medical Services you may have received from other than [facility] providers in the past year (date may be approximate).    Assessment:   This is a routine wellness examination for Rickie. Physical assessment deferred to PCP.  Hearing/Vision screen Unable to assess. This visit is enabled though telemedicine due to Covid 19.  Dietary issues and exercise activities discussed: Current Exercise Habits: Home exercise routine, Type of exercise: walking;stretching, Time (Minutes): 45, Frequency (Times/Week): 6, Weekly Exercise (Minutes/Week): 270, Intensity: Mild, Exercise limited by: None identified Diet (meal preparation, eat out, water intake, caffeinated beverages, dairy products, fruits and vegetables): in general, a "healthy" diet  , well balanced   Goals    . Maintain healthy active lifestyle      Depression Screen PHQ 2/9 Scores 05/06/2019 02/09/2019 02/09/2019  PHQ - 2 Score 0 0 0  PHQ- 9 Score - 0 -    Fall Risk Fall Risk  05/06/2019 02/09/2019  Falls in the past year? 0 0  Number falls in past yr: 0 -  Injury with Fall? 0 -  Follow up Education provided;Falls prevention discussed -   Cognitive Function:Ad8 score reviewed for issues:  Issues making decisions: no  Less interest in hobbies / activities:no  Repeats questions, stories (family complaining):no  Trouble using ordinary gadgets (microwave, computer, phone):no  Forgets the month or year: no  Mismanaging finances: no  Remembering appts:no  Daily problems with thinking and/or memory:no Ad8 score is=0         Screening Tests Health Maintenance  Topic Date Due  . Hepatitis C Screening  Never done  . HIV Screening  Never done  . PNA vac Low Risk Adult (1 of 2 - PCV13) Never done  . INFLUENZA VACCINE  08/16/2019  . TETANUS/TDAP  09/16/2019  . COLONOSCOPY  06/09/2025  . COVID-19 Vaccine  Completed       Plan:    Please schedule your next medicare wellness visit with me in 1 yr.  Continue to eat heart healthy diet (full of fruits, vegetables, whole grains, lean protein, water--limit salt, fat, and sugar intake) and increase physical activity as tolerated.  Continue doing brain stimulating activities (puzzles, reading, adult coloring books, staying active) to keep memory sharp.     I have personally reviewed and noted the following in the patient's chart:   . Medical and social history . Use of alcohol, tobacco or illicit drugs  . Current medications and supplements . Functional ability and status . Nutritional status . Physical activity . Advanced directives . List of other physicians . Hospitalizations, surgeries, and ER visits in previous 12 months . Vitals . Screenings to include cognitive, depression, and falls . Referrals and appointments  In addition, I have reviewed and discussed with patient certain preventive protocols, quality metrics, and best practice recommendations. A written personalized care plan for preventive services as well as general preventive health recommendations were provided to patient.     Naaman Plummer [place], [place]   05/06/2019

## 2019-05-06 ENCOUNTER — Ambulatory Visit (INDEPENDENT_AMBULATORY_CARE_PROVIDER_SITE_OTHER): Payer: Medicare PPO | Admitting: *Deleted

## 2019-05-06 ENCOUNTER — Encounter: Payer: Self-pay | Admitting: *Deleted

## 2019-05-06 DIAGNOSIS — Z0000 Encounter for general adult medical examination without abnormal findings: Secondary | ICD-10-CM

## 2019-05-06 NOTE — Patient Instructions (Addendum)
Please schedule your next medicare wellness visit with me in 1 yr.  Continue to eat heart healthy diet (full of fruits, vegetables, whole grains, lean protein, water--limit salt, fat, and sugar intake) and increase physical activity as tolerated.  Continue doing brain stimulating activities (puzzles, reading, adult coloring books, staying active) to keep memory sharp.    Alvin Morrison , Thank you for taking time to come for your Medicare Wellness Visit. I appreciate your ongoing commitment to your health goals. Please review the following plan we discussed and let me know if I can assist you in the future.   These are the goals we discussed: Goals    . Maintain healthy active lifestyle       This is a list of the screening recommended for you and due dates:  Health Maintenance  Topic Date Due  .  Hepatitis C: One time screening is recommended by Center for Disease Control  (CDC) for  adults born from [birth year] through 1966.   Never done  . HIV Screening  Never done  . Pneumonia vaccines (1 of 2 - PCV13) Never done  . Flu Shot  08/16/2019  . Tetanus Vaccine  09/16/2019  . Colon Cancer Screening  06/09/2025  . COVID-19 Vaccine  Completed   Preventive Care 17 Years and Older, Male Preventive care refers to lifestyle choices and visits with your health care provider that can promote health and wellness. This includes:  A yearly physical exam. This is also called an annual well check.  Regular dental and eye exams.  Immunizations.  Screening for certain conditions.  Healthy lifestyle choices, such as diet and exercise. What can I expect for my preventive care visit? Physical exam Your health care provider will check:  Height and weight. These may be used to calculate body mass index (BMI), which is a measurement that tells if you are at a healthy weight.  Heart rate and blood pressure.  Your skin for abnormal spots. Counseling Your health care provider may ask you questions  about:  Alcohol, tobacco, and drug use.  Emotional well-being.  Home and relationship well-being.  Sexual activity.  Eating habits.  History of falls.  Memory and ability to understand (cognition).  Work and work Statistician. What immunizations do I need?  Influenza (flu) vaccine  This is recommended every year. Tetanus, diphtheria, and pertussis (Tdap) vaccine  You may need a Td booster every 10 years. Varicella (chickenpox) vaccine  You may need this vaccine if you have not already been vaccinated. Zoster (shingles) vaccine  You may need this after age 57. Pneumococcal conjugate (PCV13) vaccine  One dose is recommended after age 78. Pneumococcal polysaccharide (PPSV23) vaccine  One dose is recommended after age 89. Measles, mumps, and rubella (MMR) vaccine  You may need at least one dose of MMR if you were born in 1957 or later. You may also need a second dose. Meningococcal conjugate (MenACWY) vaccine  You may need this if you have certain conditions. Hepatitis A vaccine  You may need this if you have certain conditions or if you travel or work in places where you may be exposed to hepatitis A. Hepatitis B vaccine  You may need this if you have certain conditions or if you travel or work in places where you may be exposed to hepatitis B. Haemophilus influenzae type b (Hib) vaccine  You may need this if you have certain conditions. You may receive vaccines as individual doses or as more than one vaccine together  in one shot (combination vaccines). Talk with your health care provider about the risks and benefits of combination vaccines. What tests do I need? Blood tests  Lipid and cholesterol levels. These may be checked every 5 years, or more frequently depending on your overall health.  Hepatitis C test.  Hepatitis B test. Screening  Lung cancer screening. You may have this screening every year starting at age 71 if you have a 30-pack-year history of  smoking and currently smoke or have quit within the past 15 years.  Colorectal cancer screening. All adults should have this screening starting at age 25 and continuing until age 96. Your health care provider may recommend screening at age 33 if you are at increased risk. You will have tests every 1-10 years, depending on your results and the type of screening test.  Prostate cancer screening. Recommendations will vary depending on your family history and other risks.  Diabetes screening. This is done by checking your blood sugar (glucose) after you have not eaten for a while (fasting). You may have this done every 1-3 years.  Abdominal aortic aneurysm (AAA) screening. You may need this if you are a current or former smoker.  Sexually transmitted disease (STD) testing. Follow these instructions at home: Eating and drinking  Eat a diet that includes fresh fruits and vegetables, whole grains, lean protein, and low-fat dairy products. Limit your intake of foods with high amounts of sugar, saturated fats, and salt.  Take vitamin and mineral supplements as recommended by your health care provider.  Do not drink alcohol if your health care provider tells you not to drink.  If you drink alcohol: ? Limit how much you have to 0-2 drinks a day. ? Be aware of how much alcohol is in your drink. In the [place] one drink equals one 12 oz bottle of beer (355 mL), one 5 oz glass of wine (148 mL), or one 1 oz glass of hard liquor (44 mL). Lifestyle  Take daily care of your teeth and gums.  Stay active. Exercise for at least 30 minutes on 5 or more days each week.  Do not use any products that contain nicotine or tobacco, such as cigarettes, e-cigarettes, and chewing tobacco. If you need help quitting, ask your health care provider.  If you are sexually active, practice safe sex. Use a condom or other form of protection to prevent STIs (sexually transmitted infections).  Talk with your health care  provider about taking a low-dose aspirin or statin. What's next?  Visit your health care provider once a year for a well check visit.  Ask your health care provider how often you should have your eyes and teeth checked.  Stay up to date on all vaccines. This information is not intended to replace advice given to you by your health care provider. Make sure you discuss any questions you have with your health care provider. Document Revised: 12/26/2017 Document Reviewed: 12/26/2017 Elsevier Patient Education  2020 [employer].

## 2019-08-10 ENCOUNTER — Ambulatory Visit: Payer: BC Managed Care – PPO | Admitting: Family Medicine

## 2019-10-09 ENCOUNTER — Other Ambulatory Visit: Payer: Self-pay | Admitting: Family Medicine

## 2019-10-09 DIAGNOSIS — J301 Allergic rhinitis due to pollen: Secondary | ICD-10-CM

## 2019-10-12 ENCOUNTER — Telehealth: Payer: Self-pay | Admitting: Family Medicine

## 2019-10-12 NOTE — Telephone Encounter (Signed)
Patient states that he does not feel like he should be seen every 3-6 months if he does not feel sick. He's unhappy about having to come in so many times a year and states that he might find another doctor before his next refill is due.

## 2019-10-12 NOTE — Telephone Encounter (Signed)
Patient states Dr. Doreene Burke always sends just the one refill and so he has to wait several days for approval every time he wants a refill. He just picked up Eliquis and Singulair from pharmacy and they do not have additional refills. The patient would like to know why Dr. Doreene Burke never approves more than the one refill.

## 2019-10-28 ENCOUNTER — Other Ambulatory Visit: Payer: Self-pay

## 2019-10-29 ENCOUNTER — Encounter: Payer: Self-pay | Admitting: Family Medicine

## 2019-10-29 ENCOUNTER — Telehealth: Payer: Self-pay | Admitting: Family Medicine

## 2019-10-29 ENCOUNTER — Ambulatory Visit: Payer: Medicare PPO | Admitting: Family Medicine

## 2019-10-29 ENCOUNTER — Ambulatory Visit (INDEPENDENT_AMBULATORY_CARE_PROVIDER_SITE_OTHER): Payer: Medicare PPO

## 2019-10-29 VITALS — BP 160/78 | HR 54 | Temp 97.6°F | Ht 71.0 in | Wt 192.0 lb

## 2019-10-29 DIAGNOSIS — M25559 Pain in unspecified hip: Secondary | ICD-10-CM

## 2019-10-29 DIAGNOSIS — I10 Essential (primary) hypertension: Secondary | ICD-10-CM

## 2019-10-29 DIAGNOSIS — M25551 Pain in right hip: Secondary | ICD-10-CM | POA: Diagnosis not present

## 2019-10-29 DIAGNOSIS — N5089 Other specified disorders of the male genital organs: Secondary | ICD-10-CM

## 2019-10-29 DIAGNOSIS — E559 Vitamin D deficiency, unspecified: Secondary | ICD-10-CM | POA: Diagnosis not present

## 2019-10-29 DIAGNOSIS — R7309 Other abnormal glucose: Secondary | ICD-10-CM | POA: Diagnosis not present

## 2019-10-29 DIAGNOSIS — M25552 Pain in left hip: Secondary | ICD-10-CM | POA: Diagnosis not present

## 2019-10-29 HISTORY — DX: Other abnormal glucose: R73.09

## 2019-10-29 LAB — URINALYSIS, ROUTINE W REFLEX MICROSCOPIC
Bilirubin Urine: NEGATIVE
Hgb urine dipstick: NEGATIVE
Ketones, ur: NEGATIVE
Leukocytes,Ua: NEGATIVE
Nitrite: NEGATIVE
RBC / HPF: NONE SEEN (ref 0–?)
Specific Gravity, Urine: 1.02 (ref 1.000–1.030)
Total Protein, Urine: NEGATIVE
Urine Glucose: NEGATIVE
Urobilinogen, UA: 0.2 (ref 0.0–1.0)
WBC, UA: NONE SEEN (ref 0–?)
pH: 5.5 (ref 5.0–8.0)

## 2019-10-29 LAB — COMPREHENSIVE METABOLIC PANEL
ALT: 28 U/L (ref 0–53)
AST: 24 U/L (ref 0–37)
Albumin: 4.3 g/dL (ref 3.5–5.2)
Alkaline Phosphatase: 45 U/L (ref 39–117)
BUN: 20 mg/dL (ref 6–23)
CO2: 27 mEq/L (ref 19–32)
Calcium: 9.1 mg/dL (ref 8.4–10.5)
Chloride: 102 mEq/L (ref 96–112)
Creatinine, Ser: 1.1 mg/dL (ref 0.40–1.50)
GFR: 69.74 mL/min (ref >60.00)
Glucose, Bld: 104 mg/dL — ABNORMAL HIGH (ref 70–99)
Potassium: 4.5 mEq/L (ref 3.5–5.1)
Sodium: 136 mEq/L (ref 135–145)
Total Bilirubin: 0.8 mg/dL (ref 0.2–1.2)
Total Protein: 6.5 g/dL (ref 6.0–8.3)

## 2019-10-29 LAB — CBC
HCT: 44.1 % (ref 39.0–52.0)
Hemoglobin: 14.9 g/dL (ref 13.0–17.0)
MCHC: 33.7 g/dL (ref 30.0–36.0)
MCV: 89.9 fl (ref 78.0–100.0)
Platelets: 155 10*3/uL (ref 150.0–400.0)
RBC: 4.91 Mil/uL (ref 4.22–5.81)
RDW: 13.3 % (ref 11.5–15.5)
WBC: 7.3 10*3/uL (ref 4.0–10.5)

## 2019-10-29 LAB — HEMOGLOBIN A1C: Hgb A1c MFr Bld: 5.8 % (ref 4.6–6.5)

## 2019-10-29 LAB — VITAMIN D 25 HYDROXY (VIT D DEFICIENCY, FRACTURES): VITD: 41.25 ng/mL (ref 30.00–100.00)

## 2019-10-29 MED ORDER — AMLODIPINE BESYLATE 5 MG PO TABS: 5.0000 mg | ORAL_TABLET | Freq: Every day | ORAL | 2 refills | Status: DC

## 2019-10-29 NOTE — Telephone Encounter (Signed)
Returned patients call no answer LM on identified VM informing patient that he should continue all of his current medications and the amlodipine will be in addition to medications. Also asked patient to give [place] a call back.

## 2019-10-29 NOTE — Patient Instructions (Addendum)
Preventing Hypertension Hypertension, commonly called high blood pressure, is when the force of blood pumping through the arteries is too strong. Arteries are blood vessels that carry blood from the heart throughout the body. Over time, hypertension can damage the arteries and decrease blood flow to important parts of the body, including the brain, heart, and kidneys. Often, hypertension does not cause symptoms until blood pressure is very high. For this reason, it is important to have your blood pressure checked on a regular basis. Hypertension can often be prevented with diet and lifestyle changes. If you already have hypertension, you can control it with diet and lifestyle changes, as well as medicine. What nutrition changes can be made? Maintain a healthy diet. This includes:  Eating less salt (sodium). Ask your health care provider how much sodium is safe for you to have. The general recommendation is to consume less than 1 tsp (2,300 mg) of sodium a day. ? Do not add salt to your food. ? Choose low-sodium options when grocery shopping and eating out.  Limiting fats in your diet. You can do this by eating low-fat or fat-free dairy products and by eating less red meat.  Eating more fruits, vegetables, and whole grains. Make a goal to eat: ? 1-2 cups of fresh fruits and vegetables each day. ? 3-4 servings of whole grains each day.  Avoiding foods and beverages that have added sugars.  Eating fish that contain healthy fats (omega-3 fatty acids), such as mackerel or salmon. If you need help putting together a healthy eating plan, try the DASH diet. This diet is high in fruits, vegetables, and whole grains. It is low in sodium, red meat, and added sugars. DASH stands for Dietary Approaches to Stop Hypertension. What lifestyle changes can be made?   Lose weight if you are overweight. Losing just 3?5% of your body weight can help prevent or control hypertension. ? For example, if your present  weight is 200 lb (91 kg), a loss of 3-5% of your weight means losing 6-10 lb (2.7-4.5 kg). ? Ask your health care provider to help you with a diet and exercise plan to safely lose weight.  Get enough exercise. Do at least 150 minutes of moderate-intensity exercise each week. ? You could do this in short exercise sessions several times a day, or you could do longer exercise sessions a few times a week. For example, you could take a brisk 10-minute walk or bike ride, 3 times a day, for 5 days a week.  Find ways to reduce stress, such as exercising, meditating, listening to music, or taking a yoga class. If you need help reducing stress, ask your health care provider.  Do not smoke. This includes e-cigarettes. Chemicals in tobacco and nicotine products raise your blood pressure each time you smoke. If you need help quitting, ask your health care provider.  Avoid alcohol. If you drink alcohol, limit alcohol intake to no more than 1 drink a day for nonpregnant women and 2 drinks a day for men. One drink equals 12 oz of beer, 5 oz of wine, or 1 oz of hard liquor. Why are these changes important? Diet and lifestyle changes can help you prevent hypertension, and they may make you feel better overall and improve your quality of life. If you have hypertension, making these changes will help you control it and help prevent major complications, such as:  Hardening and narrowing of arteries that supply blood to: ? Your heart. This can cause a heart  attack. ? Your brain. This can cause a stroke. ? Your kidneys. This can cause kidney failure.  Stress on your heart muscle, which can cause heart failure. What can I do to lower my risk?  Work with your health care provider to make a hypertension prevention plan that works for you. Follow your plan and keep all follow-up visits as told by your health care provider.  Learn how to check your blood pressure at home. Make sure that you know your personal target  blood pressure, as told by your health care provider. How is this treated? In addition to diet and lifestyle changes, your health care provider may recommend medicines to help lower your blood pressure. You may need to try a few different medicines to find what works best for you. You also may need to take more than one medicine. Take over-the-counter and prescription medicines only as told by your health care provider. Where to find support Your health care provider can help you prevent hypertension and help you keep your blood pressure at a healthy level. Your local hospital or your community may also provide support services and prevention programs. The American Heart Association offers an online support network at: https://www.lee.net/ Where to find more information Learn more about hypertension from:  National Heart, Lung, and Blood Institute: https://www.peterson.org/  Centers for Disease Control and Prevention: AboutHD.co.nz  American Academy of Family Physicians: http://familydoctor.org/familydoctor/en/diseases-conditions/high-blood-pressure.printerview.all.html Learn more about the DASH diet from:  National Heart, Lung, and Blood Institute: WedMap.it Contact a health care provider if:  You think you are having a reaction to medicines you have taken.  You have recurrent headaches or feel dizzy.  You have swelling in your ankles.  You have trouble with your vision. Summary  Hypertension often does not cause any symptoms until blood pressure is very high. It is important to get your blood pressure checked regularly.  Diet and lifestyle changes are the most important steps in preventing hypertension.  By keeping your blood pressure in a healthy range, you can prevent complications like heart attack, heart failure, stroke, and kidney failure.  Work with your health care  provider to make a hypertension prevention plan that works for you. This information is not intended to replace advice given to you by your health care provider. Make sure you discuss any questions you have with your health care provider. Document Revised: 04/25/2018 Document Reviewed: 09/12/2015 Elsevier Patient Education  2020 Elsevier Inc. Amlodipine Oral Tablets What is this medicine? AMLODIPINE (am LOE di peen) is a calcium channel blocker. It relaxes your blood vessels and decreases the amount of work the heart has to do. It treats high blood pressure and/or prevents chest pain (also called angina). This medicine may be used for other purposes; ask your health care provider or pharmacist if you have questions. COMMON BRAND NAME(S): Norvasc What should I tell my health care provider before I take this medicine? They need to know if you have any of these conditions:  heart disease  liver disease  an unusual or allergic reaction to amlodipine, other drugs, foods, dyes, or preservatives  pregnant or trying to get pregnant  breast-feeding How should I use this medicine? Take this drug by mouth. Take it as directed on the prescription label at the same time every day. You can take it with or without food. If it upsets your stomach, take it with food. Keep taking it unless your health care provider tells you to stop. Talk to your health care provider about  the use of this drug in children. While it may be prescribed for children as young as 6 for selected conditions, precautions do apply. Overdosage: If you think you have taken too much of this medicine contact a poison control center or emergency room at once. NOTE: This medicine is only for you. Do not share this medicine with others. What if I miss a dose? If you miss a dose, take it as soon as you can. If it is almost time for your next dose, take only that dose. Do not take double or extra doses. What may interact with this  medicine? This medicine may interact with the following medications:  clarithromycin  cyclosporine  diltiazem  itraconazole  simvastatin  tacrolimus This list may not describe all possible interactions. Give your health care provider a list of all the medicines, herbs, non-prescription drugs, or dietary supplements you use. Also tell them if you smoke, drink alcohol, or use illegal drugs. Some items may interact with your medicine. What should I watch for while using this medicine? Visit your health care provider for regular checks on your progress. Check your blood pressure as directed. Ask your health care provider what your blood pressure should be. Also, find out when you should contact him or her. Do not treat yourself for coughs, colds, or pain while you are using this drug without asking your health care provider for advice. Some drugs may increase your blood pressure. You may get drowsy or dizzy. Do not drive, use machinery, or do anything that needs mental alertness until you know how this drug affects you. Do not stand up or sit up quickly, especially if you are an older patient. This reduces the risk of dizzy or fainting spells. What side effects may I notice from receiving this medicine? Side effects that you should report to your doctor or health care provider as soon as possible:  allergic reactions (skin rash, itching or hives; swelling of the face, lips, or tongue)  heart attack (trouble breathing; pain or tightness in the chest, neck, back or arms; unusually weak or tired)  low blood pressure (dizziness; feeling faint or lightheaded, falls; unusually weak or tired) Side effects that usually do not require medical attention (report these to your doctor or health care provider if they continue or are bothersome):  facial flushing  nausea  palpitations  stomach pain  sudden weight gain  swelling of the ankles, feet, hands This list may not describe all possible  side effects. Call your doctor for medical advice about side effects. You may report side effects to FDA at 1-800-FDA-1088. Where should I keep my medicine? Keep out of the reach of children and pets. Store at room temperature between 59 and 86 degrees F (15 and 30 degrees C). Protect from light and moisture. Keep the container tightly closed. Throw away any unused drug after the expiration date. NOTE: This sheet is a summary. It may not cover all possible information. If you have questions about this medicine, talk to your doctor, pharmacist, or health care provider.  2020 Elsevier/Gold Standard (2018-10-07 19:39:45)  Preventing Type 2 Diabetes Mellitus Type 2 diabetes (type 2 diabetes mellitus) is a long-term (chronic) disease that affects blood sugar (glucose) levels. Normally, a hormone called insulin allows glucose to enter cells in the body. The cells use glucose for energy. In type 2 diabetes, one or both of these problems may be present:  The body does not make enough insulin.  The body does not respond  properly to insulin that it makes (insulin resistance). Insulin resistance or lack of insulin causes excess glucose to build up in the blood instead of going into cells. As a result, high blood glucose (hyperglycemia) develops, which can cause many complications. Being overweight or obese and having an inactive (sedentary) lifestyle can increase your risk for diabetes. Type 2 diabetes can be delayed or prevented by making certain nutrition and lifestyle changes. What nutrition changes can be made?   Eat healthy meals and snacks regularly. Keep a healthy snack with you for when you get hungry between meals, such as fruit or a handful of nuts.  Eat lean meats and proteins that are low in saturated fats, such as chicken, fish, egg whites, and beans. Avoid processed meats.  Eat plenty of fruits and vegetables and plenty of grains that have not been processed (whole grains). It is recommended  that you eat: ? 1?2 cups of fruit every day. ? 2?3 cups of vegetables every day. ? 6?8 oz of whole grains every day, such as oats, whole wheat, bulgur, brown rice, quinoa, and millet.  Eat low-fat dairy products, such as milk, yogurt, and cheese.  Eat foods that contain healthy fats, such as nuts, avocado, olive oil, and canola oil.  Drink water throughout the day. Avoid drinks that contain added sugar, such as soda or sweet tea.  Follow instructions from your health care provider about specific eating or drinking restrictions.  Control how much food you eat at a time (portion size). ? Check food labels to find out the serving sizes of foods. ? Use a kitchen scale to weigh amounts of foods.  Saute or steam food instead of frying it. Cook with water or broth instead of oils or butter.  Limit your intake of: ? Salt (sodium). Have no more than 1 tsp (2,400 mg) of sodium a day. If you have heart disease or high blood pressure, have less than ? tsp (1,500 mg) of sodium a day. ? Saturated fat. This is fat that is solid at room temperature, such as butter or fat on meat. What lifestyle changes can be made? Activity   Do moderate-intensity physical activity for at least 30 minutes on at least 5 days of the week, or as much as told by your health care provider.  Ask your health care provider what activities are safe for you. A mix of physical activities may be best, such as walking, swimming, cycling, and strength training.  Try to add physical activity into your day. For example: ? Park in spots that are farther away than usual, so that you walk more. For example, park in a far corner of the parking lot when you go to the office or the grocery store. ? Take a walk during your lunch break. ? Use stairs instead of elevators or escalators. Weight Loss  Lose weight as directed. Your health care provider can determine how much weight loss is best for you and can help you lose weight  safely.  If you are overweight or obese, you may be instructed to lose at least 5?7 % of your body weight. Alcohol and Tobacco   Limit alcohol intake to no more than 1 drink a day for nonpregnant women and 2 drinks a day for men. One drink equals 12 oz of beer, 5 oz of wine, or 1 oz of hard liquor.  Do not use any tobacco products, such as cigarettes, chewing tobacco, and e-cigarettes. If you need help quitting, ask your  health care provider. Work With Your Health Care Provider  Have your blood glucose tested regularly, as told by your health care provider.  Discuss your risk factors and how you can reduce your risk for diabetes.  Get screening tests as told by your health care provider. You may have screening tests regularly, especially if you have certain risk factors for type 2 diabetes.  Make an appointment with a diet and nutrition specialist (registered dietitian). A registered dietitian can help you make a healthy eating plan and can help you understand portion sizes and food labels. Why are these changes important?  It is possible to prevent or delay type 2 diabetes and related health problems by making lifestyle and nutrition changes.  It can be difficult to recognize signs of type 2 diabetes. The best way to avoid possible damage to your body is to take actions to prevent the disease before you develop symptoms. What can happen if changes are not made?  Your blood glucose levels may keep increasing. Having high blood glucose for a long time is dangerous. Too much glucose in your blood can damage your blood vessels, heart, kidneys, nerves, and eyes.  You may develop prediabetes or type 2 diabetes. Type 2 diabetes can lead to many chronic health problems and complications, such as: ? Heart disease. ? Stroke. ? Blindness. ? Kidney disease. ? Depression. ? Poor circulation in the feet and legs, which could lead to surgical removal (amputation) in severe cases. Where to find  support  Ask your health care provider to recommend a registered dietitian, diabetes educator, or weight loss program.  Look for local or online weight loss groups.  Join a gym, fitness club, or outdoor activity group, such as a walking club. Where to find more information To learn more about diabetes and diabetes prevention, visit:  American Diabetes Association (ADA): www.diabetes.[employer] of Diabetes and Digestive and Kidney Diseases: ToyArticles.ca To learn more about healthy eating, visit:  The U.S. Department of Agriculture Architect), Choose My Plate: http://yates.biz/  Office of Disease Prevention and Health Promotion (ODPHP), Dietary Guidelines: ListingMagazine.si Summary  You can reduce your risk for type 2 diabetes by increasing your physical activity, eating healthy foods, and losing weight as directed.  Talk with your health care provider about your risk for type 2 diabetes. Ask about any blood tests or screening tests that you need to have. This information is not intended to replace advice given to you by your health care provider. Make sure you discuss any questions you have with your health care provider. Document Revised: 04/25/2018 Document Reviewed: 02/22/2015 Elsevier Patient Education  2020 [employer].

## 2019-10-29 NOTE — Telephone Encounter (Signed)
Patient is unsure if he is supposed to discontinue some of his medications since he has had new medications added. Please give him a call at [phone].

## 2019-10-29 NOTE — Progress Notes (Signed)
Established Patient Office Visit  Subjective:  Patient ID: Alvin Morrison, male    DOB: 19-Nov-1955  Age: 64 y.o. MRN: [id number]  CC:  Chief Complaint  Patient presents with  . Pain    C/O pain on left side of groin area and left hip x 3 weeks.     HPI Alvin Morrison presents for follow-up of hypertension, vitamin D deficiency, elevated glucose.  Over the last week he has also been having pain in his left hip and buttock area.  There is pain in this area with walking and standing.  He continues to walk 7 miles daily for exercise.  Blood pressure was elevated last visit.  He told me that he was seen by cardiology the next day and it was normal.  Chart review of the cardiology encounter showed an elevated blood pressure there as well.  He had been reviewed advised to return in 6 months after his last visit but did not think it was necessary because he said that I did not personally ask him to return.   Past Medical History:  Diagnosis Date  . Allergy   . AR (allergic rhinitis)   . Eczema   . Fracture of left humerus, medial epicondyle     med. epi. not specified   . GERD (gastroesophageal reflux disease)   . HTN (hypertension)   . Paroxysmal atrial fibrillation (HCC)    chads2vasc score is 1    Past Surgical History:  Procedure Laterality Date  . APPENDECTOMY    . CARDIOVERSION N/A 04/29/2012   Procedure: CARDIOVERSION;  Surgeon: Laurey Morale, MD;  Location: [facility] ENDOSCOPY;  Service: Cardiovascular;  Laterality: N/A;  . COLONOSCOPY  01/26/05  . left arm sx     humerus sx plate and screws  . TEE WITHOUT CARDIOVERSION N/A 04/29/2012   Procedure: TRANSESOPHAGEAL ECHOCARDIOGRAM (TEE);  Surgeon: Laurey Morale, MD;  Location: [facility] ENDOSCOPY;  Service: Cardiovascular;  Laterality: N/A;    Family History  Problem Relation Age of Onset  . Heart disease Father   . Atrial fibrillation Son     Social History   Socioeconomic History  . Marital status: Married    Spouse name: Not  on file  . Number of children: Not on file  . Years of education: Not on file  . Highest education level: Not on file  Occupational History  . Not on file  Tobacco Use  . Smoking status: Never Smoker  . Smokeless tobacco: Never Used  Substance and Sexual Activity  . Alcohol use: Yes    Alcohol/week: 14.0 standard drinks    Types: 14 Cans of beer per week    Comment: 2 beers daily  . Drug use: No  . Sexual activity: Yes  Other Topics Concern  . Not on file  Social History Narrative   Married; retired; single; gets regular exercise.   Social Determinants of Health   Financial Resource Strain: Low Risk   . Difficulty of Paying Living Expenses: Not hard at all  Food Insecurity: No Food Insecurity  . Worried About Programme researcher, broadcasting/film/video in the Last Year: Never true  . Ran Out of Food in the Last Year: Never true  Transportation Needs: No Transportation Needs  . Lack of Transportation (Medical): No  . Lack of Transportation (Non-Medical): No  Physical Activity:   . Days of Exercise per Week: Not on file  . Minutes of Exercise per Session: Not on file  Stress:   .  Feeling of Stress : Not on file  Social Connections:   . Frequency of Communication with Friends and Family: Not on file  . Frequency of Social Gatherings with Friends and Family: Not on file  . Attends Religious Services: Not on file  . Active Member of Clubs or Organizations: Not on file  . Attends Banker Meetings: Not on file  . Marital Status: Not on file  Intimate Partner Violence:   . Fear of Current or Ex-Partner: Not on file  . Emotionally Abused: Not on file  . Physically Abused: Not on file  . Sexually Abused: Not on file    Outpatient Medications Prior to Visit  Medication Sig Dispense Refill  . calcium citrate-vitamin D (CITRACAL+D) 315-200 MG-UNIT per tablet Take 1 tablet by mouth daily.      . cetirizine (ZYRTEC) 10 MG tablet Take 10 mg by mouth.     Everlene Balls 5 MG TABS tablet TAKE 1  TABLET BY MOUTH 2 TIMES DAILY. 180 tablet 0  . flecainide (TAMBOCOR) 50 MG tablet TAKE 1 TABLET (50 MG TOTAL) BY MOUTH 2 TIMES DAILY. 180 tablet 3  . losartan (COZAAR) 100 MG tablet Take 1 tablet (100 mg total) by mouth daily. 90 tablet 3  . metoprolol succinate (TOPROL-XL) 50 MG 24 hr tablet Take 1 tablet (50 mg total) by mouth daily. 90 tablet 3  . montelukast (SINGULAIR) 10 MG tablet TAKE 1 TABLET BY MOUTH AT BEDTIME. 90 tablet 0  . Multiple Vitamins-Minerals (ONE-A-DAY MENS 50+ ADVANTAGE PO) Take 1 capsule by mouth daily.    [name] Kitchen omeprazole (PRILOSEC) 20 MG capsule TAKE 1 CAPSULE BY MOUTH DAILY AS NEEDED (HEARTBURN OR ACID REFLUX). 90 capsule 1  . Vitamin D, Ergocalciferol, (DRISDOL) 1.25 MG (50000 UNIT) CAPS capsule Take 1 capsule (50,000 Units total) by mouth every 7 (seven) days. 5 capsule 5  . zinc gluconate 50 MG tablet Take 50 mg by mouth daily.      [name] Kitchen azelastine (ASTELIN) 0.1 % nasal spray PLACE 1 SPRAY INTO BOTH NOSTRILS 2 TIMES DAILY AS DIRECTED (Patient not taking: Reported on 05/06/2019) 30 mL 12   No facility-administered medications prior to visit.    Allergies  Allergen Reactions  . Hydrochlorothiazide Hives and Itching    ROS Review of Systems  Constitutional: Negative for chills, diaphoresis, fatigue, fever and unexpected weight change.  HENT: Negative.   Eyes: Negative for photophobia.  Respiratory: Negative.   Cardiovascular: Negative.   Gastrointestinal: Negative.   Endocrine: Negative for polyphagia and polyuria.  Genitourinary: Negative for difficulty urinating, frequency and urgency.  Musculoskeletal: Positive for arthralgias and gait problem.  Skin: Negative for pallor and rash.  Allergic/Immunologic: Negative for immunocompromised state.  Neurological: Negative for speech difficulty and weakness.  Hematological: Does not bruise/bleed easily.  Psychiatric/Behavioral: Negative.       Objective:    Physical Exam Vitals and nursing note reviewed.   Constitutional:      General: He is not in acute distress.    Appearance: Normal appearance. He is normal weight. He is not ill-appearing, toxic-appearing or diaphoretic.  HENT:     Head: Normocephalic and atraumatic.     Right Ear: External ear normal.     Left Ear: External ear normal.  Eyes:     General: No scleral icterus.       Right eye: No discharge.        Left eye: No discharge.     Extraocular Movements: Extraocular movements intact.  Conjunctiva/sclera: Conjunctivae normal.     Pupils: Pupils are equal, round, and reactive to light.  Cardiovascular:     Rate and Rhythm: Normal rate and regular rhythm.  Pulmonary:     Effort: Pulmonary effort is normal.     Breath sounds: Normal breath sounds.  Abdominal:     General: Bowel sounds are normal.     Hernia: There is no hernia in the left inguinal area or right inguinal area.  Genitourinary:    Penis: Circumcised. No hypospadias, erythema, tenderness, discharge or swelling.      Testes:        Right: Tenderness or swelling not present. Right testis is descended.        Left: Tenderness or swelling not present. Left testis is descended.     Epididymis:     Right: Enlarged. Not inflamed.     Left: Not inflamed. Mass present.  Musculoskeletal:     Cervical back: No rigidity or tenderness.     Right hip: No tenderness. Decreased range of motion.     Left hip: No tenderness. Decreased range of motion.     Right lower leg: No edema.     Left lower leg: No edema.  Lymphadenopathy:     Cervical: No cervical adenopathy.     Lower Body: No right inguinal adenopathy. No left inguinal adenopathy.  Skin:    General: Skin is warm and dry.  Neurological:     Mental Status: He is alert and oriented to person, place, and time.     BP (!) 160/78   Pulse (!) 54   Temp 97.6 F (36.4 C) (Tympanic)   Ht 5\' 11"  (1.803 m)   Wt 192 lb (87.1 kg)   SpO2 98%   BMI 26.78 kg/m  Wt Readings from Last 3 Encounters:  10/29/19 192 lb  (87.1 kg)  02/10/19 195 lb (88.5 kg)  02/09/19 194 lb 9.6 oz (88.3 kg)     Health Maintenance Due  Topic Date Due  . Hepatitis C Screening  Never done  . HIV Screening  Never done  . PNA vac Low Risk Adult (1 of 2 - PCV13) Never done  . INFLUENZA VACCINE  08/16/2019  . TETANUS/TDAP  09/16/2019    There are no preventive care reminders to display for this patient.  Lab Results  Component Value Date   TSH 1.33 01/22/2017   Lab Results  Component Value Date   WBC 5.8 02/09/2019   HGB 15.0 02/09/2019   HCT 44.1 02/09/2019   MCV 88.5 02/09/2019   PLT 148.0 (L) 02/09/2019   Lab Results  Component Value Date   NA 137 02/09/2019   K 4.2 02/09/2019   CO2 27 02/09/2019   GLUCOSE 122 (H) 02/09/2019   BUN 27 (H) 02/09/2019   CREATININE 1.04 02/09/2019   BILITOT 0.4 02/09/2019   ALKPHOS 55 02/09/2019   AST 23 02/09/2019   ALT 27 02/09/2019   PROT 6.2 02/09/2019   ALBUMIN 4.2 02/09/2019   CALCIUM 8.8 02/09/2019   GFR 71.68 02/09/2019   Lab Results  Component Value Date   CHOL 175 02/09/2019   Lab Results  Component Value Date   HDL 36.30 (L) 02/09/2019   Lab Results  Component Value Date   LDLCALC 110 (H) 02/09/2019   Lab Results  Component Value Date   TRIG 144.0 02/09/2019   Lab Results  Component Value Date   CHOLHDL 5 02/09/2019   Lab Results  Component Value Date  HGBA1C 5.7 11/30/2014      Assessment & Plan:   Problem List Items Addressed This Visit      Cardiovascular and Mediastinum   Essential hypertension - Primary   Relevant Medications   amLODipine (NORVASC) 5 MG tablet   Other Relevant Orders   CBC   Comprehensive metabolic panel   Urinalysis, Routine w reflex microscopic     Other   Hip pain   Relevant Orders   DG Hip Unilat W OR W/O Pelvis 2-3 Views Left   DG Hip Unilat W OR W/O Pelvis 2-3 Views Right   Vitamin D deficiency   Relevant Orders   VITAMIN D 25 Hydroxy (Vit-D Deficiency, Fractures)   Scrotal mass   Relevant  Orders   US Scrotum   Elevated glucose   Relevant Orders   Hemoglobin A1c      Meds ordered this encounter  Medications  . amLODipine (NORVASC) 5 MG tablet    Sig: Take 1 tablet (5 mg total) by mouth daily.    Dispense:  30 tablet    Refill:  2    Follow-up: Return in about 3 months (around 01/29/2020).   Today, I personally asked him to return in 3 months.  He was given information on managing hypertension.  I I have added amlodipine to his current regimen and discussed that with him directly.  He was given information on amlodipine.  He was given information on preventing type 2 diabetes.    Mliss Sax, MD

## 2019-11-09 ENCOUNTER — Ambulatory Visit
Admission: RE | Admit: 2019-11-09 | Discharge: 2019-11-09 | Disposition: A | Discharge status: Home / Self Care | Payer: Medicare PPO | Source: Ambulatory Visit | Attending: Family Medicine | Admitting: Family Medicine

## 2019-11-09 DIAGNOSIS — N5089 Other specified disorders of the male genital organs: Secondary | ICD-10-CM

## 2019-11-09 DIAGNOSIS — N503 Cyst of epididymis: Secondary | ICD-10-CM | POA: Diagnosis not present

## 2020-01-05 DIAGNOSIS — I4821 Permanent atrial fibrillation: Secondary | ICD-10-CM | POA: Diagnosis not present

## 2020-01-05 DIAGNOSIS — I429 Cardiomyopathy, unspecified: Secondary | ICD-10-CM | POA: Diagnosis not present

## 2020-01-05 DIAGNOSIS — J301 Allergic rhinitis due to pollen: Secondary | ICD-10-CM | POA: Diagnosis not present

## 2020-01-05 DIAGNOSIS — I10 Essential (primary) hypertension: Secondary | ICD-10-CM | POA: Diagnosis not present

## 2020-01-12 DIAGNOSIS — H524 Presbyopia: Secondary | ICD-10-CM | POA: Diagnosis not present

## 2020-01-12 DIAGNOSIS — H18513 Endothelial corneal dystrophy, bilateral: Secondary | ICD-10-CM | POA: Diagnosis not present

## 2020-01-22 ENCOUNTER — Other Ambulatory Visit: Payer: Self-pay | Admitting: Family Medicine

## 2020-01-22 DIAGNOSIS — I10 Essential (primary) hypertension: Secondary | ICD-10-CM

## 2020-02-10 ENCOUNTER — Encounter: Payer: Medicare PPO | Admitting: Family Medicine

## 2020-02-29 ENCOUNTER — Other Ambulatory Visit: Payer: Self-pay | Admitting: Physician Assistant

## 2020-02-29 DIAGNOSIS — I480 Paroxysmal atrial fibrillation: Secondary | ICD-10-CM

## 2020-03-01 ENCOUNTER — Other Ambulatory Visit: Payer: Self-pay

## 2020-03-01 DIAGNOSIS — I480 Paroxysmal atrial fibrillation: Secondary | ICD-10-CM

## 2020-03-01 MED ORDER — FLECAINIDE ACETATE 50 MG PO TABS: ORAL_TABLET | ORAL | 0 refills | Status: DC

## 2020-03-01 MED ORDER — METOPROLOL SUCCINATE ER 50 MG PO TB24: 50.0000 mg | ORAL_TABLET | Freq: Every day | ORAL | 0 refills | Status: DC

## 2020-03-01 NOTE — Telephone Encounter (Signed)
Pt's medications were sent to pt's pharmacy as requested. Confirmation received.  

## 2020-03-02 DIAGNOSIS — I10 Essential (primary) hypertension: Secondary | ICD-10-CM | POA: Diagnosis not present

## 2020-03-02 DIAGNOSIS — I4821 Permanent atrial fibrillation: Secondary | ICD-10-CM | POA: Diagnosis not present

## 2020-03-02 DIAGNOSIS — Z125 Encounter for screening for malignant neoplasm of prostate: Secondary | ICD-10-CM | POA: Diagnosis not present

## 2020-03-02 NOTE — Progress Notes (Signed)
Cardiology Office Note Date:  03/02/2020  Patient ID:  Alvin, Morrison 1955-12-02, MRN [id number] PCP:  Mliss Sax, MD  Cardiologist:  Dr. Shirlee Latch (2015 last) Electrophysiologist: Dr. Johney Frame    Chief Complaint:   annual EP visit  History of Present Illness: Alvin Morrison is a 65 y.o. male with history of HTN, very remote mild CM (45-50% with initial diagnosis of AF in 2012, recovered LVEF), paroxysmal Afib.  He comes in today to be seen for Dr. Johney Frame.  Last seen by EP service with Uvaldo Rising, NP Jan 2020, doing well, was retired from [employer].  His EKG intervals stable on flecainide, no symptoms of AF, labs pending with his PMD.  I saw him Jan 2021 He is doing quite well.  Walks 6-64miles every day with his wife, or if raining or cold gets on the treadmill or elliptical, has very good exertional capacity and tolerance.  No CP, palpitations SOB or DOE.  No dizzy spells, no near syncope or syncope No sense of any Afib. No bleeding or signs of bleeding reports compliance with his Eliquis and all of his medicines. He checks his BP at home, often 150/70 numbers, doesn't think the losartan has worked well for him. He also mentions that of late in the evenings when relaxing he is aware of b/l heat/burning type sensation on the bottom of his feet from the balls of his feet towards toes including 3rd toe. No symptoms of claudication or pain otherwise. He was found to have excellent pedal pulses, no signs ov vascular disease. He had much room for improvement in his diet/salt intake, planned to start there, asked to let us know if BP remained >130/80 after diet adjustment and we would set up a sooner f/u  At a visit 10/29/19 with his PMD, started on norvasc, their note mentions elevated glucose as part of his hx as well, planned for labs, Hgb A1c was 5.8  TODAY He is doing very well. No CP, palpitations, SOB or DOE. He walks 6-21miles daily with excellent exertional  capacity No cardiac awareness, doesn't think he has had any AF since starting the flecainide. No dizzy spells, near syncope or syncope. No bleeding or signs of bleeding  He has been out of his amlodipine the weekend, will get today, his PMD managing his BP Had labs done with his PMD recently, will see him soon for results and check -in    AFib HX Diagnosed 2012 [facility] DCCV 2014 Flecainide started 2016 is current  Past Medical History:  Diagnosis Date  . Allergy   . AR (allergic rhinitis)   . Atrial fibrillation with RVR (HCC) 07/03/2010  . Cardiomyopathy (HCC) 08/08/2010  . Congenital cavus deformity of both feet 02/09/2019  . Eczema   . Elevated glucose 10/29/2019  . Essential hypertension 07/25/2006   Qualifier: Diagnosis of  By: Tawanna Cooler MD, Eugenio Hoes   . Fracture of left humerus, medial epicondyle     med. epi. not specified   . GERD (gastroesophageal reflux disease)   . HTN (hypertension)   . Paroxysmal atrial fibrillation (HCC)    chads2vasc score is 1  . VIRAL URI 05/22/2007   Qualifier: Diagnosis of  By: Tawanna Cooler MD, Eugenio Hoes     Past Surgical History:  Procedure Laterality Date  . APPENDECTOMY    . CARDIOVERSION N/A 04/29/2012   Procedure: CARDIOVERSION;  Surgeon: Laurey Morale, MD;  Location: [facility] ENDOSCOPY;  Service: Cardiovascular;  Laterality: N/A;  . COLONOSCOPY  01/26/05  . left arm sx     humerus sx plate and screws  . TEE WITHOUT CARDIOVERSION N/A 04/29/2012   Procedure: TRANSESOPHAGEAL ECHOCARDIOGRAM (TEE);  Surgeon: Laurey Morale, MD;  Location[facility] ENDOSCOPY;  Service: Cardiovascular;  Laterality: N/A;    Current Outpatient Medications  Medication Sig Dispense Refill  . amLODipine (NORVASC) 5 MG tablet TAKE 1 TABLET (5 MG TOTAL) BY MOUTH DAILY. 30 tablet 2  . azelastine (ASTELIN) 0.1 % nasal spray PLACE 1 SPRAY INTO BOTH NOSTRILS 2 TIMES DAILY AS DIRECTED (Patient not taking: Reported on 05/06/2019) 30 mL 12  . calcium citrate-vitamin D (CITRACAL+D) 315-200  MG-UNIT per tablet Take 1 tablet by mouth daily.      . cetirizine (ZYRTEC) 10 MG tablet Take 10 mg by mouth.     Everlene Balls 5 MG TABS tablet TAKE 1 TABLET BY MOUTH 2 TIMES DAILY. 180 tablet 0  . flecainide (TAMBOCOR) 50 MG tablet TAKE 1 TABLET (50 MG TOTAL) BY MOUTH 2 TIMES DAILY. 180 tablet 0  . losartan (COZAAR) 100 MG tablet Take 1 tablet (100 mg total) by mouth daily. 90 tablet 3  . metoprolol succinate (TOPROL-XL) 50 MG 24 hr tablet Take 1 tablet (50 mg total) by mouth daily. 90 tablet 0  . montelukast (SINGULAIR) 10 MG tablet TAKE 1 TABLET BY MOUTH AT BEDTIME. 90 tablet 0  . Multiple Vitamins-Minerals (ONE-A-DAY MENS 50+ ADVANTAGE PO) Take 1 capsule by mouth daily.    [name] Kitchen omeprazole (PRILOSEC) 20 MG capsule TAKE 1 CAPSULE BY MOUTH DAILY AS NEEDED (HEARTBURN OR ACID REFLUX). 90 capsule 1  . Vitamin D, Ergocalciferol, (DRISDOL) 1.25 MG (50000 UNIT) CAPS capsule Take 1 capsule (50,000 Units total) by mouth every 7 (seven) days. 5 capsule 5  . zinc gluconate 50 MG tablet Take 50 mg by mouth daily.       No current facility-administered medications for this visit.    Allergies:   Hydrochlorothiazide   Social History:  The patient  reports that he has never smoked. He has never used smokeless tobacco. He reports current alcohol use of about 14.0 standard drinks of alcohol per week. He reports that he does not use drugs.   Family History:  The patient's family history includes Atrial fibrillation in his son; Heart disease in his father.*  ROS:  Please see the history of present illness.  All other systems are reviewed and otherwise negative.   PHYSICAL EXAM:  VS:  There were no vitals taken for this visit. BMI: There is no height or weight on file to calculate BMI. Well nourished, well developed, in no acute distress  HEENT: normocephalic, atraumatic  Neck: no JVD, carotid bruits or masses Cardiac:  RRR; no significant murmurs, no rubs, or gallops.  He has excellent 3+ and equal PT and DP  pulses b/l, no skin changes, and brisk cap refill b/l, feet are warm b/l Lungs:   CTA b/l, no wheezing, rhonchi or rales  Abd: soft, nontender MS: no deformity or atrophy Ext:  no edema  Skin: warm and dry, no rash Neuro:  No gross deficits appreciated Psych: euthymic mood, full affect   EKG:  Done today and reviewed by myself shows  SB 50bpm, PR QRS , Qtc , stable intervals    06/21/2014: ETT Baseline ECG is normal.  There was no ST segment deviation noted during stress.  Arrhythmias during stress: none.  Arrhythmias during recovery: none.  There were no significant arrhythmias noted during the test.  ECG was interpretable.   No flecainide induced EKG changes   04/29/2012: TEE Study Conclusions  - Left ventricle: The cavity size was normal. Wall thickness  was normal. Systolic function was low normal to mildly  decreased. The estimated ejection fraction was in the  range of 50% to 55%. Wall motion was normal; there were no  regional wall motion abnormalities.  - Aortic valve: There was no stenosis. Trivial  regurgitation.  - Aorta: Normal caliber, minimal plaque.  - Mitral valve: Trivial regurgitation.  - Left atrium: The atrium was mildly to moderately dilated.  No evidence of thrombus in the atrial cavity or appendage.  - Right ventricle: The cavity size was normal.  - Right atrium: No evidence of thrombus in the atrial cavity  or appendage.  - Atrial septum: No defect or patent foramen ovale was  identified. Echo contrast study showed no right-to-left  atrial level shunt, at baseline or with provocation.  - Tricuspid valve: Peak RV-RA gradient: 22mm Hg (S).   Impressions:  - May proceed to cardioversion.   08/22/2010: stress myoview Impression Exercise Capacity:  Excellent exercise capacity. BP Response:  Normal blood pressure response. Clinical Symptoms:  No chest pain. ECG Impression:  No significant ST segment change  suggestive of ischemia. Comparison with Prior Nuclear Study: No previous nuclear study performed  Overall Impression:  Normal stress nuclear study.    Recent Labs: 10/29/2019: ALT 28; BUN 20; Creatinine, Ser 1.10; Hemoglobin 14.9; Platelets 155.0; Potassium 4.5; Sodium 136  No results found for requested labs within last 8760 hours.   CrCl cannot be calculated (Patient's most recent lab result is older than the maximum 21 days allowed.).   Wt Readings from Last 3 Encounters:  10/29/19 192 lb (87.1 kg)  02/10/19 195 lb (88.5 kg)  02/09/19 194 lb 9.6 oz (88.3 kg)     Other studies reviewed: Additional studies/records reviewed today include: summarized above  ASSESSMENT AND PLAN:  1. Paroxysmal Afib     CHA2DS2Vasc is 3     On Eliquis,  appropriately dosed     on Flecainide and metoprolol, stable intervals     No AF burden by symptoms   Excellent exertional capacity, no symptoms to suggest CAD, or change in cardiac status  2. HTN     Reports 140's-150's at home since starting the amlodipine     He will keep BP management with his PMD     Avoids sodium  Disposition: we will continue to see annually, sooner if needed.  Current medicines are reviewed at length with the patient today.  The patient did not have any concerns regarding medicines.  Norma Fredrickson, PA-C 03/02/2020 1:31 PM     [facility] [address] [place] [address] [phone] (office)  [phone] (fax)

## 2020-03-07 ENCOUNTER — Ambulatory Visit: Payer: Medicare PPO | Admitting: Physician Assistant

## 2020-03-07 ENCOUNTER — Encounter: Payer: Self-pay | Admitting: Physician Assistant

## 2020-03-07 ENCOUNTER — Other Ambulatory Visit: Payer: Self-pay

## 2020-03-07 VITALS — BP 160/72 | HR 50 | Ht 71.0 in | Wt 191.8 lb

## 2020-03-07 DIAGNOSIS — I480 Paroxysmal atrial fibrillation: Secondary | ICD-10-CM

## 2020-03-07 DIAGNOSIS — Z5181 Encounter for therapeutic drug level monitoring: Secondary | ICD-10-CM | POA: Diagnosis not present

## 2020-03-07 DIAGNOSIS — Z79899 Other long term (current) drug therapy: Secondary | ICD-10-CM | POA: Diagnosis not present

## 2020-03-07 DIAGNOSIS — I10 Essential (primary) hypertension: Secondary | ICD-10-CM | POA: Diagnosis not present

## 2020-03-07 MED ORDER — FLECAINIDE ACETATE 50 MG PO TABS: ORAL_TABLET | ORAL | 3 refills | Status: DC

## 2020-03-07 MED ORDER — METOPROLOL SUCCINATE ER 50 MG PO TB24: 50.0000 mg | ORAL_TABLET | Freq: Every day | ORAL | 3 refills | Status: DC

## 2020-03-07 MED ORDER — APIXABAN 5 MG PO TABS: 5.0000 mg | ORAL_TABLET | Freq: Two times a day (BID) | ORAL | 3 refills | Status: DC

## 2020-03-07 NOTE — Patient Instructions (Signed)
Medication Instructions:  Your physician recommends that you continue on your current medications as directed. Please refer to the Current Medication list given to you today.  *If you need a refill on your cardiac medications before your next appointment, please call your pharmacy*   Lab Work: NONE ORDERED  TODAY   If you have labs (blood work) drawn today and your tests are completely normal, you will receive your results only by: MyChart Message (if you have MyChart) OR A paper copy in the mail If you have any lab test that is abnormal or we need to change your treatment, we will call you to review the results.   Testing/Procedures: NONE ORDERED  TODAY     Follow-Up: At [facility], you and your health needs are our priority.  As part of our continuing mission to provide you with exceptional heart care, we have created designated Provider Care Teams.  These Care Teams include your primary Cardiologist (physician) and Advanced Practice Providers (APPs -  Physician Assistants and Nurse Practitioners) who all work together to provide you with the care you need, when you need it.  We recommend signing up for the patient portal called "MyChart".  Sign up information is provided on this After Visit Summary.  MyChart is used to connect with patients for Virtual Visits (Telemedicine).  Patients are able to view lab/test results, encounter notes, upcoming appointments, etc.  Non-urgent messages can be sent to your provider as well.   To learn more about what you can do with MyChart, go to [email or web address].    Your next appointment:   1 year(s)  The format for your next appointment:   In Person  Provider:   You may see Renee Ursuy, PA-C    Other Instructions  

## 2020-03-09 DIAGNOSIS — Z0000 Encounter for general adult medical examination without abnormal findings: Secondary | ICD-10-CM | POA: Diagnosis not present

## 2020-03-09 DIAGNOSIS — I4821 Permanent atrial fibrillation: Secondary | ICD-10-CM | POA: Diagnosis not present

## 2020-03-09 DIAGNOSIS — R972 Elevated prostate specific antigen [PSA]: Secondary | ICD-10-CM | POA: Diagnosis not present

## 2020-03-09 DIAGNOSIS — R7303 Prediabetes: Secondary | ICD-10-CM | POA: Diagnosis not present

## 2020-03-09 DIAGNOSIS — J301 Allergic rhinitis due to pollen: Secondary | ICD-10-CM | POA: Diagnosis not present

## 2020-03-09 DIAGNOSIS — I10 Essential (primary) hypertension: Secondary | ICD-10-CM | POA: Diagnosis not present

## 2020-04-04 DIAGNOSIS — J301 Allergic rhinitis due to pollen: Secondary | ICD-10-CM | POA: Diagnosis not present

## 2020-05-27 NOTE — Progress Notes (Signed)
Subjective:    CC: R knee pain  HPI: Pt is a 65 y/o male presenting w/ R knee pain ongoing since mid-April. MOI: Pt slipped and "hyperextended" R knee.   He locates his pain to the anterior-lateral asepct. Pt also c/o R calf pain since the 1st week in March after doing a backpacking trip. Pt described pain as a "cramp" and notes calf turned "blue."  R knee swelling: yes R knee mechanical symptoms: yes Aggravating factors: bearing weight, down stairs, knee flexion Treatments tried: Tylenol  Pertinent review of Systems: No fevers or chills  Relevant historical information: Cardiomyopathy A. fib   Objective:    Vitals:   05/30/20 1000  BP: (!) 160/98  Pulse: 60  SpO2: 98%   General: Well Developed, well nourished, and in no acute distress.   MSK: Left knee no effusion. Normal motion. Tender palpation posterior medial joint line. Stable ligaments exam. Positive lateral McMurray's test.   Calves bilaterally normal.  Minimally tender musculotendinous junction gastrocnemius and Achilles tendon. Normal strength  Lab and Radiology Results  X-ray images left knee obtained today personally and independently interpreted Moderate to severe lateral compartment DJD.  No acute fractures are visible. Await formal radiology review  Procedure: Real-time Ultrasound Guided Injection of left knee superior lateral patellar space Device: Philips Affiniti 50G Images permanently stored and available for review in PACS Present evaluation left knee prior to injection reveals degenerative appearing lateral meniscus with visible tear partially extruded especially at posterior aspect Verbal informed consent obtained.  Discussed risks and benefits of procedure. Warned about infection bleeding damage to structures skin hypopigmentation and fat atrophy among others. Patient expresses understanding and agreement Time-out conducted.   Noted no overlying erythema, induration, or other signs  of local infection.   Skin prepped in a sterile fashion.   Local anesthesia: Topical Ethyl chloride.   With sterile technique and under real time ultrasound guidance:  40 mg of Kenalog and 2 mL of Marcaine injected into knee joint. Fluid seen entering the joint capsule.   Completed without difficulty   Pain immediately resolved suggesting accurate placement of the medication.   Advised to call if fevers/chills, erythema, induration, drainage, or persistent bleeding.   Images permanently stored and available for review in the ultrasound unit.  Impression: Technically successful ultrasound guided injection.        Impression and Recommendations:    Assessment and Plan: 65 y.o. male with posterior lateral meniscus tear with lateral compartment degenerative changes on x-ray.  This is the most likely cause of his knee pain.  Plan for steroid injection compressive knee sleeve and Voltaren gel.  Patient patient has evidence of some calf strain.  Plan for home exercise program taught in clinic today by ATC.[name] Kitchen  Recheck in 6 weeks.  PDMP not reviewed this encounter. Orders Placed This Encounter  Procedures  . [place] LIMITED JOINT SPACE STRUCTURES LOW LEFT(NO LINKED CHARGES)    Order Specific Question:   Reason for Exam (SYMPTOM  OR DIAGNOSIS REQUIRED)    Answer:   left knee    Order Specific Question:   Preferred imaging location?    Answer:   Adult nurse Sports Medicine[facility]  . DG Knee AP/LAT W/Sunrise Left    Standing Status:   Future    Number of Occurrences:   1    Standing Expiration Date:   05/30/2021    Order Specific Question:   Reason for Exam (SYMPTOM  OR DIAGNOSIS REQUIRED)    Answer:  eval knee pain    Order Specific Question:   Preferred imaging location?    Answer:   [facility]   No orders of the defined types were placed in this encounter.   Discussed warning signs or symptoms. Please see discharge instructions. Patient expresses understanding.   The  above documentation has been reviewed and is accurate and complete Clementeen Graham, M.D.

## 2020-05-30 ENCOUNTER — Ambulatory Visit: Payer: Self-pay

## 2020-05-30 ENCOUNTER — Ambulatory Visit: Payer: Medicare PPO | Admitting: Family Medicine

## 2020-05-30 ENCOUNTER — Ambulatory Visit (INDEPENDENT_AMBULATORY_CARE_PROVIDER_SITE_OTHER): Payer: Medicare PPO

## 2020-05-30 ENCOUNTER — Other Ambulatory Visit: Payer: Self-pay

## 2020-05-30 VITALS — BP 160/98 | HR 60 | Ht 71.0 in | Wt 188.6 lb

## 2020-05-30 DIAGNOSIS — M25562 Pain in left knee: Secondary | ICD-10-CM

## 2020-05-30 DIAGNOSIS — M79662 Pain in left lower leg: Secondary | ICD-10-CM

## 2020-05-30 DIAGNOSIS — M79661 Pain in right lower leg: Secondary | ICD-10-CM | POA: Diagnosis not present

## 2020-05-30 NOTE — Patient Instructions (Addendum)
Thank you for coming in today.  Please use Voltaren gel (Generic Diclofenac Gel) up to 4x daily for pain as needed.  This is available over-the-counter as both the name brand Voltaren gel and the generic diclofenac gel.  I recommend you obtained a compression sleeve to help with your joint problems. There are many options on the market however I recommend obtaining a full knee Body Helix compression sleeve.  You can find information (including how to appropriate measure yourself for sizing) can be found at www.Body [employer].ch.  Many of these products are health savings account (HSA) eligible.   You can use the compression sleeve at any time throughout the day but is most important to use while being active as well as for 2 hours post-activity.   It is appropriate to ice following activity with the compression sleeve in place.   Do the calf exercises we dicussed: View at my-exercise-code.com using code: 2WT218C  Recheck in about 6 weeks.   Call or go to the ER if you develop a large red swollen joint with extreme pain or oozing puss.

## 2020-05-31 DIAGNOSIS — M25562 Pain in left knee: Secondary | ICD-10-CM | POA: Diagnosis not present

## 2020-05-31 NOTE — Progress Notes (Signed)
Left knee x-ray shows mild arthritis.

## 2020-07-08 NOTE — Progress Notes (Addendum)
   I, [name], LAT, ATC, am serving as scribe for Dr. Clementeen Graham.  Alvin Morrison is a 65 y.o. male who presents to [facility] today for f/u of L knee pain.  He was last seen by Dr. Denyse Amass on 05/30/20 and had a L knee injection.  He was also advised to use Voltaren gel and a knee compression sleeve.  Since his last visit, pt reports that his L knee is feeling much better, rating his improvement at 70-80%.  He notes some con't soreness along his L lateral knee but notes that he can do a lot more "with his knee."  His biggest aggravating factors at this point are walking downhill or soreness after prolonged walking for exercise.  Diagnostic testing: L knee XR- 05/30/20   Pertinent review of systems: No fevers or chills  Relevant historical information: Hypertension   Exam:  BP 140/80 (BP Location: Left Arm, Patient Position: Sitting, Cuff Size: Normal)   Pulse 61   Ht 5\' 11"  (1.803 m)   Wt 189 lb 6.4 oz (85.9 kg)   SpO2 98%   BMI 26.42 kg/m  General: Well Developed, well nourished, and in no acute distress.   MSK: Left knee normal-appearing normal motion. Slight laxity LCL stress test. Stable ligamentous and exam otherwise.     Lab and Radiology Results  X-ray images left knee obtained May 30, 2020 showed more lateral compartment DJD per my interpretation.  Radiology overread showed mild DJD.     Assessment and Plan: 65 y.o. male with left knee pain predominantly lateral compartment per my interpretation.  Patient did pretty well with a steroid injection about 6 weeks ago and is about 70% 80% better.  Discussed options.  Plan to continue conservative management and advance activity as tolerated.  He would like to resume hiking and heavier duty walking.  I think he has a pretty good candidate for a lateral off loader knee brace to manage instability and DJD..  I discussed his options and he like to think about it a little bit but would let me  know if he would like to proceed with that in the future.  Additionally could proceed with hyaluronic acid injections in the future as well and certainly could repeat steroid injections every 3 months as needed.  Recheck with me as needed.   Discussed warning signs or symptoms. Please see discharge instructions. Patient expresses understanding.   The above documentation has been reviewed and is accurate and complete 71, M.D.   Total encounter time 20 minutes including face-to-face time with the patient and, reviewing past medical record, and charting on the date of service.   Treatment plan and options

## 2020-07-11 ENCOUNTER — Other Ambulatory Visit: Payer: Self-pay

## 2020-07-11 ENCOUNTER — Ambulatory Visit: Payer: Medicare PPO | Admitting: Family Medicine

## 2020-07-11 ENCOUNTER — Encounter: Payer: Self-pay | Admitting: Family Medicine

## 2020-07-11 VITALS — BP 140/80 | HR 61 | Ht 71.0 in | Wt 189.4 lb

## 2020-07-11 DIAGNOSIS — M2352 Chronic instability of knee, left knee: Secondary | ICD-10-CM

## 2020-07-11 DIAGNOSIS — M25562 Pain in left knee: Secondary | ICD-10-CM | POA: Diagnosis not present

## 2020-07-11 DIAGNOSIS — M1712 Unilateral primary osteoarthritis, left knee: Secondary | ICD-10-CM

## 2020-07-11 NOTE — Patient Instructions (Signed)
Thank you for coming in today.   I can repeat the shot every 3 months.   I can do those gel shots.   You are also a good candidate for a lateral offloader brace for arthritis from [employer].  Let me know if you want to consider that.    Recheck with me as needed.   Stay active.

## 2020-07-26 ENCOUNTER — Encounter: Payer: Self-pay | Admitting: Family Medicine

## 2020-08-31 DIAGNOSIS — R972 Elevated prostate specific antigen [PSA]: Secondary | ICD-10-CM | POA: Diagnosis not present

## 2020-08-31 DIAGNOSIS — R7303 Prediabetes: Secondary | ICD-10-CM | POA: Diagnosis not present

## 2020-08-31 DIAGNOSIS — I10 Essential (primary) hypertension: Secondary | ICD-10-CM | POA: Diagnosis not present

## 2020-09-07 DIAGNOSIS — I10 Essential (primary) hypertension: Secondary | ICD-10-CM | POA: Diagnosis not present

## 2020-09-07 DIAGNOSIS — R7303 Prediabetes: Secondary | ICD-10-CM | POA: Diagnosis not present

## 2020-09-07 DIAGNOSIS — I429 Cardiomyopathy, unspecified: Secondary | ICD-10-CM | POA: Diagnosis not present

## 2020-09-07 DIAGNOSIS — I4821 Permanent atrial fibrillation: Secondary | ICD-10-CM | POA: Diagnosis not present

## 2020-09-08 DIAGNOSIS — M1712 Unilateral primary osteoarthritis, left knee: Secondary | ICD-10-CM | POA: Diagnosis not present

## 2020-09-21 ENCOUNTER — Encounter: Payer: Self-pay | Admitting: Family Medicine

## 2020-09-28 ENCOUNTER — Ambulatory Visit: Payer: Self-pay

## 2020-09-28 ENCOUNTER — Other Ambulatory Visit: Payer: Self-pay

## 2020-09-28 ENCOUNTER — Ambulatory Visit: Payer: Medicare PPO | Admitting: Family Medicine

## 2020-09-28 DIAGNOSIS — M1712 Unilateral primary osteoarthritis, left knee: Secondary | ICD-10-CM | POA: Diagnosis not present

## 2020-09-28 DIAGNOSIS — G8929 Other chronic pain: Secondary | ICD-10-CM | POA: Diagnosis not present

## 2020-09-28 DIAGNOSIS — M25562 Pain in left knee: Secondary | ICD-10-CM | POA: Diagnosis not present

## 2020-09-28 NOTE — Progress Notes (Signed)
Alvin Morrison presents to clinic today for Orthovisc injection left knee 1/3  Procedure: Real-time Ultrasound Guided Injection of left knee superior lateral patellar space Device: Philips Affiniti 50G Images permanently stored and available for review in PACS Verbal informed consent obtained.  Discussed risks and benefits of procedure. Warned about infection bleeding damage to structures skin hypopigmentation and fat atrophy among others. Patient expresses understanding and agreement Time-out conducted.   Noted no overlying erythema, induration, or other signs of local infection.   Skin prepped in a sterile fashion.   Local anesthesia: Topical Ethyl chloride.   With sterile technique and under real time ultrasound guidance: Orthovisc 30 mg injected into knee joint. Fluid seen entering the joint capsule.   Completed without difficulty   Advised to call if fevers/chills, erythema, induration, drainage, or persistent bleeding.   Images permanently stored and available for review in the ultrasound unit.  Impression: Technically successful ultrasound guided injection.  Lot #[id number]  Return in 1 week for Orthovisc injection left knee 2/3

## 2020-09-28 NOTE — Patient Instructions (Signed)
Good to see you today.  You had a L knee Orthovisc 1/3.  Call or go to the ER if you develop a large red swollen joint with extreme pain or oozing puss.   Please make sure you have 2 follow-up visits scheduled for the next 2 weeks (1 each week for the next 2 weeks) for the 2nd and 3rd injections in the series.

## 2020-10-10 ENCOUNTER — Ambulatory Visit (INDEPENDENT_AMBULATORY_CARE_PROVIDER_SITE_OTHER): Payer: Medicare PPO | Admitting: Family Medicine

## 2020-10-10 ENCOUNTER — Other Ambulatory Visit: Payer: Self-pay

## 2020-10-10 ENCOUNTER — Ambulatory Visit: Payer: Self-pay

## 2020-10-10 DIAGNOSIS — M1712 Unilateral primary osteoarthritis, left knee: Secondary | ICD-10-CM

## 2020-10-10 DIAGNOSIS — G8929 Other chronic pain: Secondary | ICD-10-CM | POA: Diagnosis not present

## 2020-10-10 DIAGNOSIS — M25562 Pain in left knee: Secondary | ICD-10-CM | POA: Diagnosis not present

## 2020-10-10 NOTE — Progress Notes (Signed)
Vonna Kotyk presents to clinic today for left knee Orthovisc injection 2/3  Procedure: Real-time Ultrasound Guided Injection of left knee superior lateral patellar space Device: Philips Affiniti 50G Images permanently stored and available for review in PACS Verbal informed consent obtained.  Discussed risks and benefits of procedure. Warned about infection bleeding damage to structures skin hypopigmentation and fat atrophy among others. Patient expresses understanding and agreement Time-out conducted.   Noted no overlying erythema, induration, or other signs of local infection.   Skin prepped in a sterile fashion.   Local anesthesia: Topical Ethyl chloride.   With sterile technique and under real time ultrasound guidance: Orthovisc 30 mg injected into knee joint. Fluid seen entering the joint capsule.   Completed without difficulty    Advised to call if fevers/chills, erythema, induration, drainage, or persistent bleeding.   Images permanently stored and available for review in the ultrasound unit.  Impression: Technically successful ultrasound guided injection.  Lot #[id number]  Return in 1 week for Orthovisc injection left knee 3/3

## 2020-10-10 NOTE — Patient Instructions (Signed)
Good to see you today.  You had a L knee Orthovisc injection 2/3.  Call or go to the ER if you develop a large red swollen joint with extreme pain or oozing puss.   Follow-up in 1 week for the 3rd round in the series.

## 2020-10-17 ENCOUNTER — Ambulatory Visit: Payer: Self-pay

## 2020-10-17 ENCOUNTER — Other Ambulatory Visit: Payer: Self-pay

## 2020-10-17 ENCOUNTER — Ambulatory Visit (INDEPENDENT_AMBULATORY_CARE_PROVIDER_SITE_OTHER): Payer: Medicare PPO | Admitting: Family Medicine

## 2020-10-17 DIAGNOSIS — M25562 Pain in left knee: Secondary | ICD-10-CM | POA: Diagnosis not present

## 2020-10-17 DIAGNOSIS — M1712 Unilateral primary osteoarthritis, left knee: Secondary | ICD-10-CM | POA: Diagnosis not present

## 2020-10-17 DIAGNOSIS — G8929 Other chronic pain: Secondary | ICD-10-CM | POA: Diagnosis not present

## 2020-10-17 NOTE — Patient Instructions (Signed)
Thank you for coming in today.   You received an injection today, completing the Orthovisc gel series. Seek immediate medical attention if the joint becomes red, extremely painful, or is oozing fluid.   Recheck back as needed.

## 2020-10-17 NOTE — Progress Notes (Signed)
Vonna Kotyk presents to clinic today for Orthovisc injection left knee 3/3  Procedure: Real-time Ultrasound Guided Injection of left knee superior lateral patellar space Device: Philips Affiniti 50G Images permanently stored and available for review in PACS Verbal informed consent obtained.  Discussed risks and benefits of procedure. Warned about infection bleeding damage to structures skin hypopigmentation and fat atrophy among others. Patient expresses understanding and agreement Time-out conducted.   Noted no overlying erythema, induration, or other signs of local infection.   Skin prepped in a sterile fashion.   Local anesthesia: Topical Ethyl chloride.   With sterile technique and under real time ultrasound guidance: Orthovisc 30 mg injected into knee joint. Fluid seen entering the joint capsule.   Completed without difficulty   Advised to call if fevers/chills, erythema, induration, drainage, or persistent bleeding.   Images permanently stored and available for review in the ultrasound unit.  Impression: Technically successful ultrasound guided injection.  Lot number: [id number]  Return as needed

## 2021-01-17 DIAGNOSIS — H2513 Age-related nuclear cataract, bilateral: Secondary | ICD-10-CM | POA: Diagnosis not present

## 2021-01-17 DIAGNOSIS — H18513 Endothelial corneal dystrophy, bilateral: Secondary | ICD-10-CM | POA: Diagnosis not present

## 2021-02-08 DIAGNOSIS — H2512 Age-related nuclear cataract, left eye: Secondary | ICD-10-CM | POA: Diagnosis not present

## 2021-02-08 DIAGNOSIS — H25812 Combined forms of age-related cataract, left eye: Secondary | ICD-10-CM | POA: Diagnosis not present

## 2021-02-22 DIAGNOSIS — H25811 Combined forms of age-related cataract, right eye: Secondary | ICD-10-CM | POA: Diagnosis not present

## 2021-02-22 DIAGNOSIS — H2511 Age-related nuclear cataract, right eye: Secondary | ICD-10-CM | POA: Diagnosis not present

## 2021-03-04 NOTE — Progress Notes (Signed)
Cardiology Office Note Date:  03/07/2021  Patient ID:  Alvin Morrison, Alvin Morrison 05/28/1955, MRN [id number] PCP:  Georgianne Fick, MD  Cardiologist:  Dr. Shirlee Latch (2015 last) Electrophysiologist: Dr. Johney Frame    Chief Complaint:   annual EP visit  History of Present Illness: Alvin Morrison is a 66 y.o. male with history of HTN, very remote mild CM (45-50% with initial diagnosis of AF in 2012, recovered LVEF), paroxysmal Afib.  He comes in today to be seen for Dr. Johney Frame (last 2019).  Last seen by EP service with Alvin Rising, NP Jan 2020, doing well, was retired from [employer].  His EKG intervals stable on flecainide, no symptoms of AF, labs pending with his PMD.  I saw him Jan 2021 He is doing quite well.  Walks 6-59miles every day with his wife, or if raining or cold gets on the treadmill or elliptical, has very good exertional capacity and tolerance.  No CP, palpitations SOB or DOE.  No dizzy spells, no near syncope or syncope No sense of any Afib. No bleeding or signs of bleeding reports compliance with his Eliquis and all of his medicines. He checks his BP at home, often 150/70 numbers, doesn't think the losartan has worked well for him. He also mentions that of late in the evenings when relaxing he is aware of b/l heat/burning type sensation on the bottom of his feet from the balls of his feet towards toes including 3rd toe. No symptoms of claudication or pain otherwise. He was found to have excellent pedal pulses, no signs ov vascular disease. He had much room for improvement in his diet/salt intake, planned to start there, asked to let Alvin Morrison know if BP remained >130/80 after diet adjustment and we would set up a sooner f/u  At a visit 10/29/19 with his PMD, started on norvasc, their note mentions elevated glucose as part of his hx as well, planned for labs, Hgb A1c was 5.8  I saw him Feb 2022 He is doing very well. No CP, palpitations, SOB or DOE. He walks 6-45miles daily with  excellent exertional capacity No cardiac awareness, doesn't think he has had any AF since starting the flecainide. No dizzy spells, near syncope or syncope. No bleeding or signs of bleeding He has been out of his amlodipine the weekend, will get today, his PMD managing his BP Had labs done with his PMD recently, will see him soon for results and check -in  TODAY He is doing well Had cataract surgery on one eye about  3 weeks ago and the other a week or so ago.  Weaning off the steroid gtts this week. Noted his BP unusually high at home for him since the surgery/gtts, 130's/80's home  No symptoms of AFib or cardiac awareness Continues to walk 5-9miles nearly daily with excellent exertional capacity No near syncope or syncope No SOB, DOE No bleeding or signs of bleeding  AFib HX Diagnosed 2012 [facility] DCCV 2014 Flecainide started 2016 is current  Past Medical History:  Diagnosis Date   Allergy    AR (allergic rhinitis)    Atrial fibrillation with RVR (HCC) 07/03/2010   Cardiomyopathy (HCC) 08/08/2010   Congenital cavus deformity of both feet 02/09/2019   Eczema    Elevated glucose 10/29/2019   Essential hypertension 07/25/2006   Qualifier: Diagnosis of  By: Tawanna Cooler MD, Tinnie Gens A    Fracture of left humerus, medial epicondyle     med. epi. not specified    GERD (gastroesophageal  reflux disease)    HTN (hypertension)    Paroxysmal atrial fibrillation (HCC)    chads2vasc score is 1   VIRAL URI 05/22/2007   Qualifier: Diagnosis of  By: Tawanna Cooler MD, Eugenio Hoes     Past Surgical History:  Procedure Laterality Date   APPENDECTOMY     CARDIOVERSION N/A 04/29/2012   Procedure: CARDIOVERSION;  Surgeon: Laurey Morale, MD;  Location: [facility] ENDOSCOPY;  Service: Cardiovascular;  Laterality: N/A;   COLONOSCOPY  01/26/05   left arm sx     humerus sx plate and screws   TEE WITHOUT CARDIOVERSION N/A 04/29/2012   Procedure: TRANSESOPHAGEAL ECHOCARDIOGRAM (TEE);  Surgeon: Laurey Morale, MD;  Location: [facility]  ENDOSCOPY;  Service: Cardiovascular;  Laterality: N/A;    Current Outpatient Medications  Medication Sig Dispense Refill   amLODipine (NORVASC) 5 MG tablet TAKE 1 TABLET (5 MG TOTAL) BY MOUTH DAILY. 30 tablet 2   azelastine (ASTELIN) 0.1 % nasal spray PLACE 1 SPRAY INTO BOTH NOSTRILS 2 TIMES DAILY AS DIRECTED 30 mL 12   losartan (COZAAR) 100 MG tablet Take 1 tablet (100 mg total) by mouth daily. 90 tablet 3   montelukast (SINGULAIR) 10 MG tablet TAKE 1 TABLET BY MOUTH AT BEDTIME. 90 tablet 0   Multiple Vitamins-Minerals (ONE-A-DAY MENS 50+ ADVANTAGE PO) Take 1 capsule by mouth daily.     omeprazole (PRILOSEC) 20 MG capsule TAKE 1 CAPSULE BY MOUTH DAILY AS NEEDED (HEARTBURN OR ACID REFLUX). 90 capsule 1   OVER THE COUNTER MEDICATION Take 1 tablet by mouth daily. Nerve Vive supplement     PRESCRIPTION MEDICATION as directed. prednisolone eye drops     Vitamin D, Ergocalciferol, (DRISDOL) 1.25 MG (50000 UNIT) CAPS capsule Take 1 capsule (50,000 Units total) by mouth every 7 (seven) days. 5 capsule 5   zinc gluconate 50 MG tablet Take 50 mg by mouth daily.     apixaban (ELIQUIS) 5 MG TABS tablet Take 1 tablet (5 mg total) by mouth 2 (two) times daily. 180 tablet 3   flecainide (TAMBOCOR) 50 MG tablet TAKE 1 TABLET (50 MG TOTAL) BY MOUTH 2 TIMES DAILY. 180 tablet 3   metoprolol succinate (TOPROL-XL) 50 MG 24 hr tablet Take 1 tablet (50 mg total) by mouth daily. 90 tablet 3   No current facility-administered medications for this visit.    Allergies:   Hydrochlorothiazide   Social History:  The patient  reports that he has never smoked. He has never used smokeless tobacco. He reports current alcohol use of about 14.0 standard drinks per week. He reports that he does not use drugs.   Family History:  The patient's family history includes Atrial fibrillation in his son; Heart disease in his father.*  ROS:  Please see the history of present illness.  All other systems are reviewed and otherwise  negative.   PHYSICAL EXAM:  VS:  BP (!) 162/88    Pulse (!) 57    Ht 5\' 11"  (1.803 m)    Wt 194 lb 12.8 oz (88.4 kg)    SpO2 99%    BMI 27.17 kg/m  BMI: Body mass index is 27.17 kg/m. Well nourished, well developed, in no acute distress  HEENT: normocephalic, atraumatic  Neck: no JVD, carotid bruits or masses Cardiac:  RRR; no significant murmurs, no rubs, or gallops. Lungs:   CTA b/l, no wheezing, rhonchi or rales  Abd: soft, nontender MS: no deformity or atrophy Ext:   no edema  Skin: warm and dry, no rash  Neuro:  No gross deficits appreciated Psych: euthymic mood, full affect   EKG:  Done today and reviewed by myself shows  SB 57bpm, PR , QRS , QTc  Last: SB 50bpm, PR , QRS , Qtc , stable intervals    06/21/2014: ETT Baseline ECG is normal.  There was no ST segment deviation noted during stress.  Arrhythmias during stress: none.  Arrhythmias during recovery: none.  There were no significant arrhythmias noted during the test.   ECG was interpretable.   No flecainide induced EKG changes   04/29/2012: TEE Study Conclusions  - Left ventricle: The cavity size was normal. Wall thickness    was normal. Systolic function was low normal to mildly    decreased. The estimated ejection fraction was in the    range of 50% to 55%. Wall motion was normal; there were no    regional wall motion abnormalities.  - Aortic valve: There was no stenosis. Trivial    regurgitation.  - Aorta: Normal caliber, minimal plaque.  - Mitral valve: Trivial regurgitation.  - Left atrium: The atrium was mildly to moderately dilated.    No evidence of thrombus in the atrial cavity or appendage.  - Right ventricle: The cavity size was normal.  - Right atrium: No evidence of thrombus in the atrial cavity    or appendage.  - Atrial septum: No defect or patent foramen ovale was    identified. Echo contrast study showed no right-to-left    atrial level shunt, at baseline or  with provocation.  - Tricuspid valve: Peak RV-RA gradient: 4mm Hg (S).   Impressions:  - May proceed to cardioversion.   08/22/2010: stress myoview Impression Exercise Capacity:  Excellent exercise capacity. BP Response:  Normal blood pressure response. Clinical Symptoms:  No chest pain. ECG Impression:  No significant ST segment change suggestive of ischemia. Comparison with Prior Nuclear Study: No previous nuclear study performed   Overall Impression:  Normal stress nuclear study.    Recent Labs: No results found for requested labs within last 8760 hours.  No results found for requested labs within last 8760 hours.   CrCl cannot be calculated (Patient's most recent lab result is older than the maximum 21 days allowed.).   Wt Readings from Last 3 Encounters:  03/07/21 194 lb 12.8 oz (88.4 kg)  07/11/20 189 lb 6.4 oz (85.9 kg)  05/30/20 188 lb 9.6 oz (85.5 kg)     Other studies reviewed: Additional studies/records reviewed today include: summarized above  ASSESSMENT AND PLAN:  1. Paroxysmal Afib     CHA2DS2Vasc is 3     On Eliquis,  appropriately dosed     on Flecainide and metoprolol, w/stable  intervals     No AF burden by symptoms  Labs scheduled with his PMD next week   2. HTN     Unusually high for him, he will monitor, see his PMD in 2 weeks  Disposition: He will transition to Dr. Elberta Fortis, plan for a visit in 37mo, sooner if needed   Current medicines are reviewed at length with the patient today.  The patient did not have any concerns regarding medicines.  Norma Fredrickson, PA-C 03/07/2021 10:55 AM     [facility] [address] [place] [address] [phone] (office)  [phone] (fax)

## 2021-03-07 ENCOUNTER — Encounter: Payer: Self-pay | Admitting: Physician Assistant

## 2021-03-07 ENCOUNTER — Ambulatory Visit: Payer: Medicare PPO | Admitting: Physician Assistant

## 2021-03-07 ENCOUNTER — Other Ambulatory Visit: Payer: Self-pay

## 2021-03-07 VITALS — BP 162/88 | HR 57 | Ht 71.0 in | Wt 194.8 lb

## 2021-03-07 DIAGNOSIS — I480 Paroxysmal atrial fibrillation: Secondary | ICD-10-CM

## 2021-03-07 DIAGNOSIS — I10 Essential (primary) hypertension: Secondary | ICD-10-CM

## 2021-03-07 DIAGNOSIS — Z79899 Other long term (current) drug therapy: Secondary | ICD-10-CM | POA: Diagnosis not present

## 2021-03-07 DIAGNOSIS — Z5181 Encounter for therapeutic drug level monitoring: Secondary | ICD-10-CM

## 2021-03-07 MED ORDER — FLECAINIDE ACETATE 50 MG PO TABS: ORAL_TABLET | ORAL | 3 refills | Status: DC

## 2021-03-07 MED ORDER — APIXABAN 5 MG PO TABS: 5.0000 mg | ORAL_TABLET | Freq: Two times a day (BID) | ORAL | 3 refills | Status: DC

## 2021-03-07 MED ORDER — METOPROLOL SUCCINATE ER 50 MG PO TB24: 50.0000 mg | ORAL_TABLET | Freq: Every day | ORAL | 3 refills | Status: DC

## 2021-03-07 NOTE — Patient Instructions (Addendum)

## 2021-03-14 DIAGNOSIS — R5383 Other fatigue: Secondary | ICD-10-CM | POA: Diagnosis not present

## 2021-03-14 DIAGNOSIS — R39198 Other difficulties with micturition: Secondary | ICD-10-CM | POA: Diagnosis not present

## 2021-03-14 DIAGNOSIS — I10 Essential (primary) hypertension: Secondary | ICD-10-CM | POA: Diagnosis not present

## 2021-03-14 DIAGNOSIS — Z125 Encounter for screening for malignant neoplasm of prostate: Secondary | ICD-10-CM | POA: Diagnosis not present

## 2021-03-14 DIAGNOSIS — I4821 Permanent atrial fibrillation: Secondary | ICD-10-CM | POA: Diagnosis not present

## 2021-03-14 DIAGNOSIS — I429 Cardiomyopathy, unspecified: Secondary | ICD-10-CM | POA: Diagnosis not present

## 2021-04-28 DIAGNOSIS — J301 Allergic rhinitis due to pollen: Secondary | ICD-10-CM | POA: Diagnosis not present

## 2021-04-28 DIAGNOSIS — E7800 Pure hypercholesterolemia, unspecified: Secondary | ICD-10-CM | POA: Diagnosis not present

## 2021-04-28 DIAGNOSIS — I10 Essential (primary) hypertension: Secondary | ICD-10-CM | POA: Diagnosis not present

## 2021-04-28 DIAGNOSIS — R7303 Prediabetes: Secondary | ICD-10-CM | POA: Diagnosis not present

## 2021-04-28 DIAGNOSIS — Z125 Encounter for screening for malignant neoplasm of prostate: Secondary | ICD-10-CM | POA: Diagnosis not present

## 2021-04-28 DIAGNOSIS — I429 Cardiomyopathy, unspecified: Secondary | ICD-10-CM | POA: Diagnosis not present

## 2021-04-28 DIAGNOSIS — Z0000 Encounter for general adult medical examination without abnormal findings: Secondary | ICD-10-CM | POA: Diagnosis not present

## 2021-04-28 DIAGNOSIS — I4821 Permanent atrial fibrillation: Secondary | ICD-10-CM | POA: Diagnosis not present

## 2021-09-07 IMAGING — DX DG HIP (WITH OR WITHOUT PELVIS) 2-3V*R*
2 series · 2 of 2 positions shown · non-contrast
Comparison: None.

CLINICAL DATA: Limited range of motion.  Pain.  No injury.

EXAM:
DG HIP (WITH OR WITHOUT PELVIS) 2-3V RIGHT

[pelvis ap]
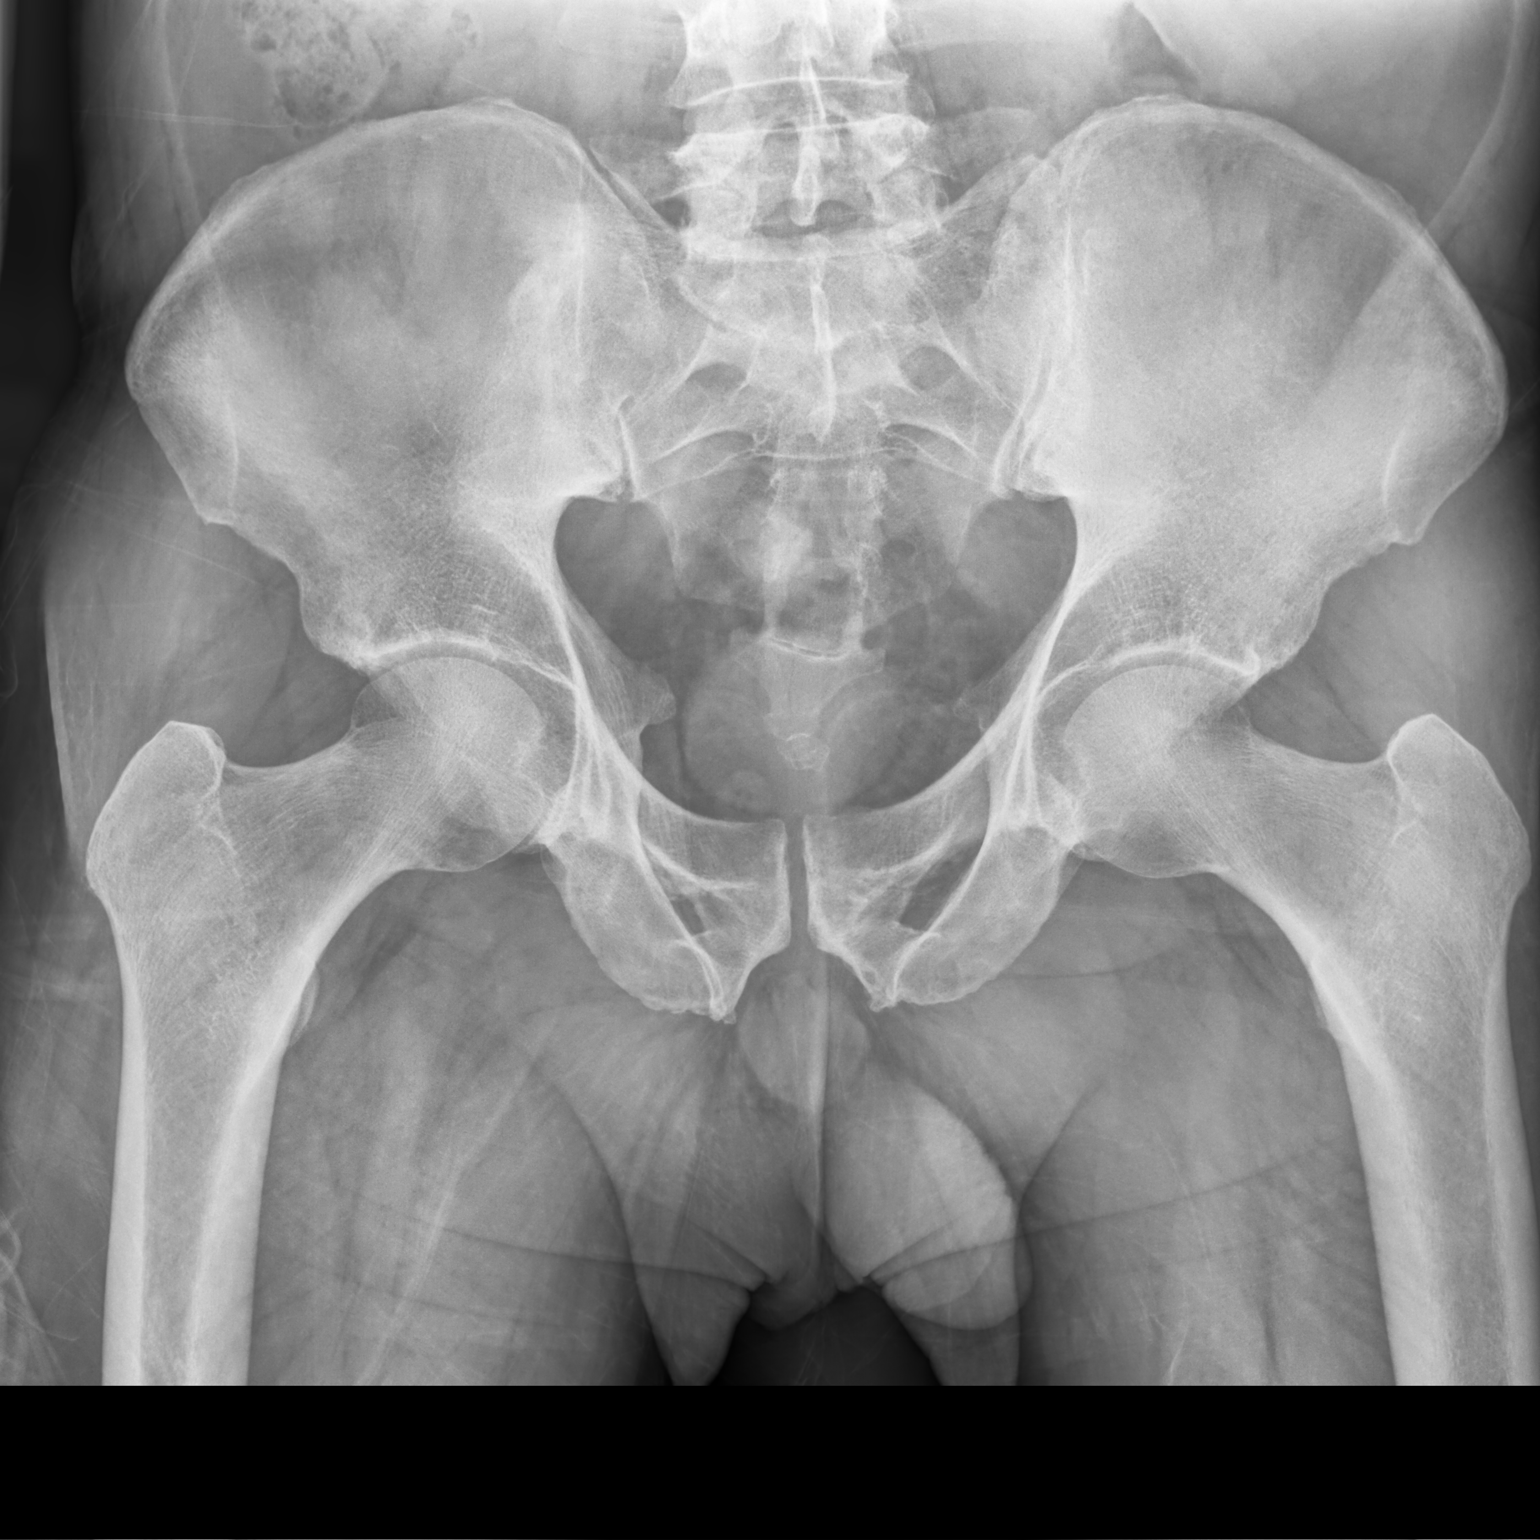

[hip frog leg lat]
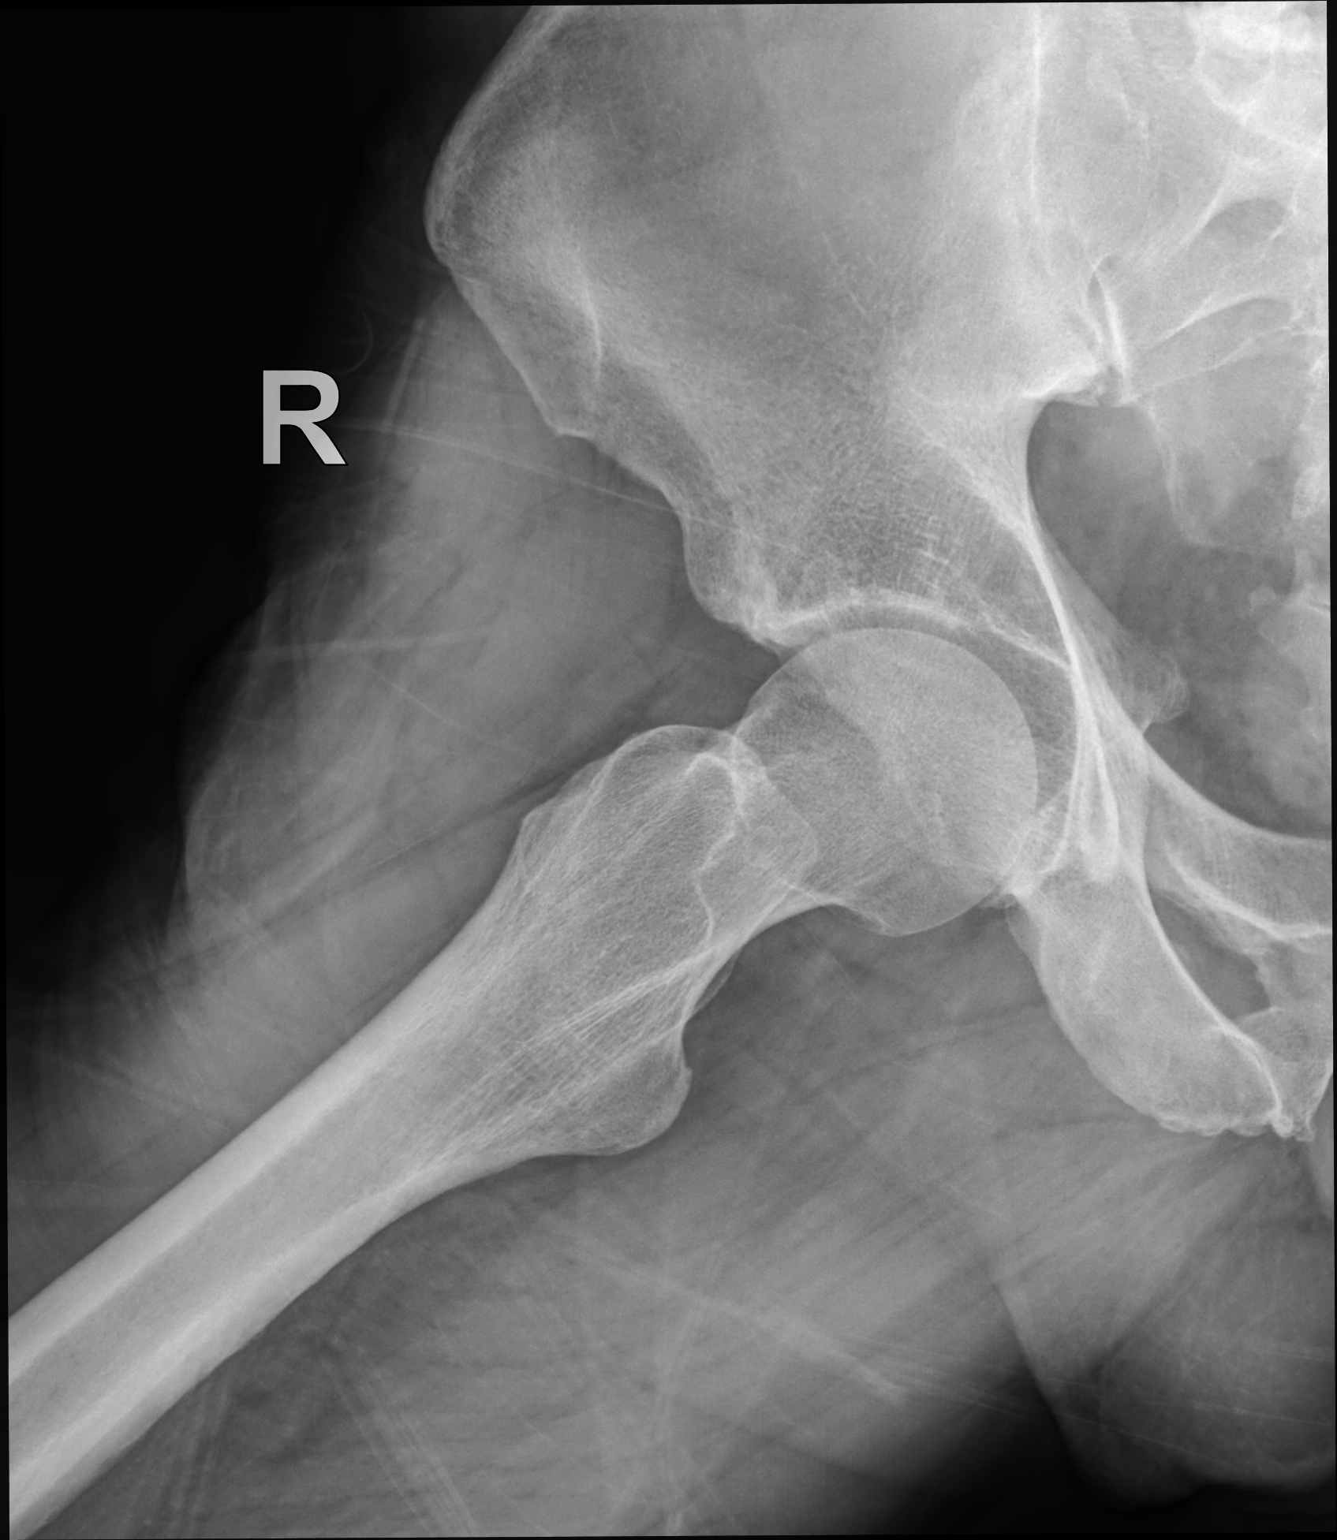

[2 of 2 positions shown; findings below may reference images not displayed]

FINDINGS: There is no evidence of hip fracture or dislocation. There is no
evidence of arthropathy or other focal bone abnormality.
IMPRESSION: Negative.

## 2021-10-12 DIAGNOSIS — Z961 Presence of intraocular lens: Secondary | ICD-10-CM | POA: Diagnosis not present

## 2022-03-13 ENCOUNTER — Encounter: Payer: Self-pay | Admitting: Cardiology

## 2022-03-13 ENCOUNTER — Ambulatory Visit: Payer: Medicare PPO | Attending: Cardiology | Admitting: Cardiology

## 2022-03-13 VITALS — BP 140/76 | HR 55 | Ht 71.0 in | Wt 191.0 lb

## 2022-03-13 DIAGNOSIS — I4819 Other persistent atrial fibrillation: Secondary | ICD-10-CM | POA: Diagnosis not present

## 2022-03-13 DIAGNOSIS — I10 Essential (primary) hypertension: Secondary | ICD-10-CM | POA: Diagnosis not present

## 2022-03-13 DIAGNOSIS — Z79899 Other long term (current) drug therapy: Secondary | ICD-10-CM

## 2022-03-13 DIAGNOSIS — D6869 Other thrombophilia: Secondary | ICD-10-CM | POA: Diagnosis not present

## 2022-03-13 NOTE — Patient Instructions (Signed)
Medication Instructions:  Your physician recommends that you continue on your current medications as directed. Please refer to the Current Medication list given to you today.  *If you need a refill on your cardiac medications before your next appointment, please call your pharmacy*   Lab Work: None ordered   Testing/Procedures: None ordered   Follow-Up: At [facility], you and your health needs are our priority.  As part of our continuing mission to provide you with exceptional heart care, we have created designated Provider Care Teams.  These Care Teams include your primary Cardiologist (physician) and Advanced Practice Providers (APPs -  Physician Assistants and Nurse Practitioners) who all work together to provide you with the care you need, when you need it.  Your next appointment:   1 year(s)  The format for your next appointment:   In Person  Provider:   Tommye Standard, PA-C    Thank you for choosing [facility]!!   Trinidad Curet, RN [phone]

## 2022-03-13 NOTE — Progress Notes (Signed)
Electrophysiology Office Note   Date:  03/13/2022   ID:  Dragon, Stagg [birth year], MRN [id number]  PCP:  Merrilee Seashore, MD  Cardiologist:  Aundra Dubin Primary Electrophysiologist:  Ciela Mahajan Meredith Leeds, MD    Chief Complaint: AF   History of Present Illness: Alvin Morrison is a 67 y.o. male who is being seen today for the evaluation of AF at the request of Merrilee Seashore, MD. Presenting today for electrophysiology evaluation.  He has a history significant for hypertension, cardiomyopathy with improved ejection fraction, paroxysmal atrial fibrillation.  He is a retired Engineer, structural.  Atrial fibrillation was diagnosed in 2012.  He had a cardioversion in 2014.  He is now on flecainide.  Today, he denies symptoms of palpitations, chest pain, shortness of breath, orthopnea, PND, lower extremity edema, claudication, dizziness, presyncope, syncope, bleeding, or neurologic sequela. The patient is tolerating medications without difficulties.    Past Medical History:  Diagnosis Date   Allergy    AR (allergic rhinitis)    Atrial fibrillation with RVR ([place]) 07/03/2010   Cardiomyopathy ([place]) 08/08/2010   Congenital cavus deformity of both feet 02/09/2019   Eczema    Elevated glucose 10/29/2019   Essential hypertension 07/25/2006   Qualifier: Diagnosis of  By: Sherren Mocha MD, Dellis Filbert A    Fracture of left humerus, medial epicondyle     med. epi. not specified    GERD (gastroesophageal reflux disease)    HTN (hypertension)    Paroxysmal atrial fibrillation ([place])    chads2vasc score is 1   VIRAL URI 05/22/2007   Qualifier: Diagnosis of  By: Sherren Mocha MD, Jory Ee    Past Surgical History:  Procedure Laterality Date   APPENDECTOMY     CARDIOVERSION N/A 04/29/2012   Procedure: CARDIOVERSION;  Surgeon: Larey Dresser, MD;  Location: [facility];  Service: Cardiovascular;  Laterality: N/A;   COLONOSCOPY  01/26/05   left arm sx     humerus sx plate and screws   TEE WITHOUT CARDIOVERSION  N/A 04/29/2012   Procedure: TRANSESOPHAGEAL ECHOCARDIOGRAM (TEE);  Surgeon: Larey Dresser, MD;  Location: [facility];  Service: Cardiovascular;  Laterality: N/A;     Current Outpatient Medications  Medication Sig Dispense Refill   amLODipine (NORVASC) 5 MG tablet TAKE 1 TABLET (5 MG TOTAL) BY MOUTH DAILY. 30 tablet 2   apixaban (ELIQUIS) 5 MG TABS tablet Take 1 tablet (5 mg total) by mouth 2 (two) times daily. 180 tablet 3   azelastine (ASTELIN) 0.1 % nasal spray PLACE 1 SPRAY INTO BOTH NOSTRILS 2 TIMES DAILY AS DIRECTED 30 mL 12   flecainide (TAMBOCOR) 50 MG tablet TAKE 1 TABLET (50 MG TOTAL) BY MOUTH 2 TIMES DAILY. 180 tablet 3   losartan (COZAAR) 100 MG tablet Take 1 tablet (100 mg total) by mouth daily. 90 tablet 3   metoprolol succinate (TOPROL-XL) 50 MG 24 hr tablet Take 1 tablet (50 mg total) by mouth daily. 90 tablet 3   montelukast (SINGULAIR) 10 MG tablet TAKE 1 TABLET BY MOUTH AT BEDTIME. 90 tablet 0   Multiple Vitamins-Minerals (ONE-A-DAY MENS 50+ ADVANTAGE PO) Take 1 capsule by mouth daily.     omeprazole (PRILOSEC) 20 MG capsule TAKE 1 CAPSULE BY MOUTH DAILY AS NEEDED (HEARTBURN OR ACID REFLUX). 90 capsule 1   OVER THE COUNTER MEDICATION Take 1 tablet by mouth daily. Nerve Vive supplement     PRESCRIPTION MEDICATION as directed. prednisolone eye drops     zinc gluconate 50 MG tablet Take 50 mg  by mouth daily.     Vitamin D, Ergocalciferol, (DRISDOL) 1.25 MG (50000 UNIT) CAPS capsule Take 1 capsule (50,000 Units total) by mouth every 7 (seven) days. (Patient not taking: Reported on 03/13/2022) 5 capsule 5   No current facility-administered medications for this visit.    Allergies:   Hydrochlorothiazide   Social History:  The patient  reports that he has never smoked. He has never used smokeless tobacco. He reports current alcohol use of about 14.0 standard drinks of alcohol per week. He reports that he does not use drugs.   Family History:  The patient's family history  includes Atrial fibrillation in his son; Heart disease in his father.    ROS:  Please see the history of present illness.   Otherwise, review of systems is positive for none.   All other systems are reviewed and negative.    PHYSICAL EXAM: VS:  BP (!) 140/76   Pulse (!) 55   Ht '5\' 11"'$  (1.803 m)   Wt 191 lb (86.6 kg)   SpO2 98%   BMI 26.64 kg/m  , BMI Body mass index is 26.64 kg/m. GEN: Well nourished, well developed, in no acute distress  HEENT: normal  Neck: no JVD, carotid bruits, or masses Cardiac: RRR; no murmurs, rubs, or gallops,no edema  Respiratory:  clear to auscultation bilaterally, normal work of breathing GI: soft, nontender, nondistended, + BS MS: no deformity or atrophy  Skin: warm and dry Neuro:  Strength and sensation are intact Psych: euthymic mood, full affect  EKG:  EKG is ordered today. Personal review of the ekg ordered shows sinus rhythm  Recent Labs: No results found for requested labs within last 365 days.    Lipid Panel     Component Value Date/Time   CHOL 175 02/09/2019 0942   TRIG 144.0 02/09/2019 0942   HDL 36.30 (L) 02/09/2019 0942   CHOLHDL 5 02/09/2019 0942   VLDL 28.8 02/09/2019 0942   LDLCALC 110 (H) 02/09/2019 0942   LDLDIRECT 107.0 02/09/2019 0942     Wt Readings from Last 3 Encounters:  03/13/22 191 lb (86.6 kg)  03/07/21 194 lb 12.8 oz (88.4 kg)  07/11/20 189 lb 6.4 oz (85.9 kg)      Other studies Reviewed: Additional studies/ records that were reviewed today include: TEE 2014  Review of the above records today demonstrates:  - Left ventricle: The cavity size was normal. Wall thickness    was normal. Systolic function was low normal to mildly    decreased. The estimated ejection fraction was in the    range of 50% to 55%. Wall motion was normal; there were no    regional wall motion abnormalities.  - Aortic valve: There was no stenosis. Trivial    regurgitation.  - Aorta: Normal caliber, minimal plaque.  - Mitral  valve: Trivial regurgitation.  - Left atrium: The atrium was mildly to moderately dilated.    No evidence of thrombus in the atrial cavity or appendage.  - Right ventricle: The cavity size was normal.  - Right atrium: No evidence of thrombus in the atrial cavity    or appendage.  - Atrial septum: No defect or patent foramen ovale was    identified. Echo contrast study showed no right-to-left    atrial level shunt, at baseline or with provocation.  - Tricuspid valve: Peak RV-RA gradient: 1m Hg (S).    ASSESSMENT AND PLAN:  1.  Paroxysmal atrial fibrillation: CHA2DS2-VASc of 3.  Currently on Eliquis, flecainide,  metoprolol.  He has remained in sinus rhythm.  He has no chest pain or shortness of breath.  He is able to do all of his daily activities.  No changes.  2.  Hypertension: Currently well-controlled  3.  Secondary hypercoagulable state: Currently on Eliquis for atrial fibrillation  4.  High risk medication monitoring: Currently on flecainide.  PR, QRS intervals remained stable.    Current medicines are reviewed at length with the patient today.   The patient does not have concerns regarding his medicines.  The following changes were made today:  none  Labs/ tests ordered today include:  Orders Placed This Encounter  Procedures   EKG 12-Lead     Disposition:   FU 1 year  Signed, Tailynn Armetta Meredith Leeds, MD  03/13/2022 10:19 AM     [facility] [address] [place] [address] [phone] (office) [phone] (fax)

## 2022-03-15 ENCOUNTER — Other Ambulatory Visit: Payer: Self-pay | Admitting: Physician Assistant

## 2022-04-16 ENCOUNTER — Other Ambulatory Visit: Payer: Self-pay | Admitting: Physician Assistant

## 2022-04-16 DIAGNOSIS — I480 Paroxysmal atrial fibrillation: Secondary | ICD-10-CM

## 2022-04-16 MED ORDER — METOPROLOL SUCCINATE ER 50 MG PO TB24: 50.0000 mg | ORAL_TABLET | Freq: Every day | ORAL | 3 refills | Status: DC

## 2022-04-16 NOTE — Telephone Encounter (Signed)
Pt's medication was sent to pt's pharmacy as requested. Confirmation received.  °

## 2022-04-16 NOTE — Telephone Encounter (Signed)
Eliquis 5mg  refill request received. Patient is 67 years old, weight-86.6kg, Crea-1.01 on 03/02/20 via [facility], Diagnosis-Afib, and last seen by Dr. Curt Bears on 03/13/22. Dose is appropriate based on dosing criteria.  Pt needs updated labs. Called PCP and she states he will have labs drawn on 05/03/22. Will send in a adequate supply until appt and update pt regarding this. Spoke with pt and updated him on this matter and he verbalized understanding.

## 2022-05-03 DIAGNOSIS — Z0000 Encounter for general adult medical examination without abnormal findings: Secondary | ICD-10-CM | POA: Diagnosis not present

## 2022-05-03 DIAGNOSIS — R7303 Prediabetes: Secondary | ICD-10-CM | POA: Diagnosis not present

## 2022-05-03 DIAGNOSIS — Z125 Encounter for screening for malignant neoplasm of prostate: Secondary | ICD-10-CM | POA: Diagnosis not present

## 2022-05-03 DIAGNOSIS — E7800 Pure hypercholesterolemia, unspecified: Secondary | ICD-10-CM | POA: Diagnosis not present

## 2022-05-03 LAB — LAB REPORT - SCANNED
A1c: 5.6
EGFR: 74

## 2022-05-11 DIAGNOSIS — J301 Allergic rhinitis due to pollen: Secondary | ICD-10-CM | POA: Diagnosis not present

## 2022-05-11 DIAGNOSIS — N182 Chronic kidney disease, stage 2 (mild): Secondary | ICD-10-CM | POA: Diagnosis not present

## 2022-05-11 DIAGNOSIS — R7303 Prediabetes: Secondary | ICD-10-CM | POA: Diagnosis not present

## 2022-05-11 DIAGNOSIS — R972 Elevated prostate specific antigen [PSA]: Secondary | ICD-10-CM | POA: Diagnosis not present

## 2022-05-11 DIAGNOSIS — Z0000 Encounter for general adult medical examination without abnormal findings: Secondary | ICD-10-CM | POA: Diagnosis not present

## 2022-05-11 DIAGNOSIS — I4821 Permanent atrial fibrillation: Secondary | ICD-10-CM | POA: Diagnosis not present

## 2022-05-11 DIAGNOSIS — I10 Essential (primary) hypertension: Secondary | ICD-10-CM | POA: Diagnosis not present

## 2022-05-11 DIAGNOSIS — Z125 Encounter for screening for malignant neoplasm of prostate: Secondary | ICD-10-CM | POA: Diagnosis not present

## 2022-05-29 ENCOUNTER — Other Ambulatory Visit: Payer: Self-pay | Admitting: *Deleted

## 2022-05-29 DIAGNOSIS — I480 Paroxysmal atrial fibrillation: Secondary | ICD-10-CM

## 2022-05-29 MED ORDER — APIXABAN 5 MG PO TABS: 5.0000 mg | ORAL_TABLET | Freq: Two times a day (BID) | ORAL | 1 refills | Status: DC

## 2022-05-29 NOTE — Telephone Encounter (Signed)
Eliquis 5mg  refill request received. Patient is 67 years old, weight-86.6kg, Crea-1.03 on 05/04/22 via [facility], Diagnosis-Afib, and last seen by Dr. Elberta Fortis on 03/13/22. Dose is appropriate based on dosing criteria. Will send in refill to requested pharmacy.

## 2022-07-13 ENCOUNTER — Other Ambulatory Visit: Payer: Self-pay | Admitting: Cardiology

## 2022-07-13 DIAGNOSIS — I480 Paroxysmal atrial fibrillation: Secondary | ICD-10-CM

## 2022-07-13 NOTE — Telephone Encounter (Signed)
Prescription refill request for Eliquis received. Indication: Afib  Last office visit: 03/13/22 ([place])  Scr: 1.03 (05/03/22)  Age: 67 Weight: 86.6kg  Appropriate dose. Refill sent.

## 2022-07-17 ENCOUNTER — Encounter: Payer: Self-pay | Admitting: Cardiology

## 2022-07-18 ENCOUNTER — Other Ambulatory Visit: Payer: Self-pay | Admitting: *Deleted

## 2022-07-18 DIAGNOSIS — I480 Paroxysmal atrial fibrillation: Secondary | ICD-10-CM

## 2022-07-18 MED ORDER — APIXABAN 5 MG PO TABS: 5.0000 mg | ORAL_TABLET | Freq: Two times a day (BID) | ORAL | 1 refills | Status: DC

## 2022-07-18 NOTE — Telephone Encounter (Signed)
Prescription refill request for Eliquis received. Indication: afib  Last office visit: [place], 03/13/2022 Scr: 05/03/2022, 1.03 Age: 67 yo  Weight: 86.6 kg

## 2022-10-16 DIAGNOSIS — H18513 Endothelial corneal dystrophy, bilateral: Secondary | ICD-10-CM | POA: Diagnosis not present

## 2022-11-07 DIAGNOSIS — D225 Melanocytic nevi of trunk: Secondary | ICD-10-CM | POA: Diagnosis not present

## 2022-11-07 DIAGNOSIS — Z1283 Encounter for screening for malignant neoplasm of skin: Secondary | ICD-10-CM | POA: Diagnosis not present

## 2023-03-08 ENCOUNTER — Other Ambulatory Visit: Payer: Self-pay | Admitting: Physician Assistant

## 2023-03-11 NOTE — Progress Notes (Unsigned)
 Cardiology Office Note Date:  03/11/2023  Patient ID:  Alvin Morrison, Alvin Morrison January 18, 1955, MRN [id number] PCP:  Georgianne Fick, MD  Cardiologist:  Dr. Shirlee Latch (2015 last) Electrophysiologist: Dr. Johney Frame >>> Dr. Elberta Fortis    Chief Complaint:   *** annual EP visit  History of Present Illness: Alvin Morrison is a 68 y.o. male with history of HTN, very remote mild CM (45-50% with initial diagnosis of AF in 2012, recovered LVEF), paroxysmal Afib.  He saw Dr. Johney Frame (last 2019).  Last seen by EP service with Uvaldo Rising, NP Jan 2020, doing well, was retired from [employer].  His EKG intervals stable on flecainide, no symptoms of AF, labs pending with his PMD.   I have seen him a few times over the years  I saw him 03/07/21 He is doing well Had cataract surgery on one eye about  3 weeks ago and the other a week or so ago.  Weaning off the steroid gtts this week. Noted his BP unusually high at home for him since the surgery/gtts, 130's/80's home No symptoms of AFib or cardiac awareness Continues to walk 5-60miles nearly daily with excellent exertional capacity No near syncope or syncope No SOB, DOE No bleeding or signs of bleeding Stable intervals, no changes made Planned to transition to Dr. Myrtis Ser Dr. Elberta Fortis 03/13/26, doing well, no changes were made  *** flecainide, EKG, nodal blocker *** eliquis, dose, bleeding, labs *** symptoms  AFib HX Diagnosed 2012 AAD DCCV 2014 Flecainide started 2016 is current  Past Medical History:  Diagnosis Date   Allergy    AR (allergic rhinitis)    Atrial fibrillation with RVR ([facility]) 07/03/2010   Cardiomyopathy ([facility]) 08/08/2010   Congenital cavus deformity of both feet 02/09/2019   Eczema    Elevated glucose 10/29/2019   Essential hypertension 07/25/2006   Qualifier: Diagnosis of  By: Tawanna Cooler MD, Tinnie Gens A    Fracture of left humerus, medial epicondyle     med. epi. not specified    GERD (gastroesophageal reflux disease)    HTN  (hypertension)    Paroxysmal atrial fibrillation ([facility])    chads2vasc score is 1   VIRAL URI 05/22/2007   Qualifier: Diagnosis of  By: Tawanna Cooler MD, Eugenio Hoes     Past Surgical History:  Procedure Laterality Date   APPENDECTOMY     CARDIOVERSION N/A 04/29/2012   Procedure: CARDIOVERSION;  Surgeon: Laurey Morale, MD;  Location: [facility];  Service: Cardiovascular;  Laterality: N/A;   COLONOSCOPY  01/26/05   left arm sx     humerus sx plate and screws   TEE WITHOUT CARDIOVERSION N/A 04/29/2012   Procedure: TRANSESOPHAGEAL ECHOCARDIOGRAM (TEE);  Surgeon: Laurey Morale, MD;  Location: [facility] ENDOSCOPY;  Service: Cardiovascular;  Laterality: N/A;    Current Outpatient Medications  Medication Sig Dispense Refill   amLODipine (NORVASC) 5 MG tablet TAKE 1 TABLET (5 MG TOTAL) BY MOUTH DAILY. 30 tablet 2   apixaban (ELIQUIS) 5 MG TABS tablet Take 1 tablet (5 mg total) by mouth 2 (two) times daily. 180 tablet 1   azelastine (ASTELIN) 0.1 % nasal spray PLACE 1 SPRAY INTO BOTH NOSTRILS 2 TIMES DAILY AS DIRECTED 30 mL 12   flecainide (TAMBOCOR) 50 MG tablet TAKE 1 TABLET BY MOUTH 2 TIMES DAILY. 180 tablet 0   losartan (COZAAR) 100 MG tablet Take 1 tablet (100 mg total) by mouth daily. 90 tablet 3   metoprolol succinate (TOPROL-XL) 50 MG 24 hr tablet Take 1  tablet (50 mg total) by mouth daily. 90 tablet 3   montelukast (SINGULAIR) 10 MG tablet TAKE 1 TABLET BY MOUTH AT BEDTIME. 90 tablet 0   Multiple Vitamins-Minerals (ONE-A-DAY MENS 50+ ADVANTAGE PO) Take 1 capsule by mouth daily.     omeprazole (PRILOSEC) 20 MG capsule TAKE 1 CAPSULE BY MOUTH DAILY AS NEEDED (HEARTBURN OR ACID REFLUX). 90 capsule 1   OVER THE COUNTER MEDICATION Take 1 tablet by mouth daily. Nerve Vive supplement     PRESCRIPTION MEDICATION as directed. prednisolone eye drops     Vitamin D, Ergocalciferol, (DRISDOL) 1.25 MG (50000 UNIT) CAPS capsule Take 1 capsule (50,000 Units total) by mouth every 7 (seven) days. (Patient not taking:  Reported on 03/13/2022) 5 capsule 5   zinc gluconate 50 MG tablet Take 50 mg by mouth daily.     No current facility-administered medications for this visit.    Allergies:   Hydrochlorothiazide   Social History:  The patient  reports that he has never smoked. He has never used smokeless tobacco. He reports current alcohol use of about 14.0 standard drinks of alcohol per week. He reports that he does not use drugs.   Family History:  The patient's family history includes Atrial fibrillation in his son; Heart disease in his father.*  ROS:  Please see the history of present illness.  All other systems are reviewed and otherwise negative.   PHYSICAL EXAM:  VS:  There were no vitals taken for this visit. BMI: There is no height or weight on file to calculate BMI. Well nourished, well developed, in no acute distress  HEENT: normocephalic, atraumatic  Neck: no JVD, carotid bruits or masses Cardiac: *** RRR; no significant murmurs, no rubs, or gallops. Lungs: ***  CTA b/l, no wheezing, rhonchi or rales  Abd: soft, nontender MS: no deformity or atrophy Ext:  *** no edema  Skin: warm and dry, no rash Neuro:  No gross deficits appreciated Psych: euthymic mood, full affect   EKG:  Done today and reviewed by myself shows  ***  03/07/21: SB 57bpm, PR , QRS , QTc Older;     SB 50bpm, PR , QRS , Qtc , stable intervals    06/21/2014: ETT Baseline ECG is normal.  There was no ST segment deviation noted during stress.  Arrhythmias during stress: none.  Arrhythmias during recovery: none.  There were no significant arrhythmias noted during the test.   ECG was interpretable.   No flecainide induced EKG changes   04/29/2012: TEE Study Conclusions  - Left ventricle: The cavity size was normal. Wall thickness    was normal. Systolic function was low normal to mildly    decreased. The estimated ejection fraction was in the    range of 50% to 55%. Wall motion was  normal; there were no    regional wall motion abnormalities.  - Aortic valve: There was no stenosis. Trivial    regurgitation.  - Aorta: Normal caliber, minimal plaque.  - Mitral valve: Trivial regurgitation.  - Left atrium: The atrium was mildly to moderately dilated.    No evidence of thrombus in the atrial cavity or appendage.  - Right ventricle: The cavity size was normal.  - Right atrium: No evidence of thrombus in the atrial cavity    or appendage.  - Atrial septum: No defect or patent foramen ovale was    identified. Echo contrast study showed no right-to-left    atrial level shunt, at baseline or with provocation.  -  Tricuspid valve: Peak RV-RA gradient: 14mm Hg (S).   Impressions:  - May proceed to cardioversion.   08/22/2010: stress myoview Impression Exercise Capacity:  Excellent exercise capacity. BP Response:  Normal blood pressure response. Clinical Symptoms:  No chest pain. ECG Impression:  No significant ST segment change suggestive of ischemia. Comparison with Prior Nuclear Study: No previous nuclear study performed   Overall Impression:  Normal stress nuclear study.    Recent Labs: No results found for requested labs within last 365 days.  No results found for requested labs within last 365 days.   CrCl cannot be calculated (Patient's most recent lab result is older than the maximum 21 days allowed.).   Wt Readings from Last 3 Encounters:  03/13/22 191 lb (86.6 kg)  03/07/21 194 lb 12.8 oz (88.4 kg)  07/11/20 189 lb 6.4 oz (85.9 kg)     Other studies reviewed: Additional studies/records reviewed today include: summarized above  ASSESSMENT AND PLAN:  1. Paroxysmal Afib     CHA2DS2Vasc is *** 3     On Eliquis, *** appropriately dosed     on Flecainide and *** metoprolol, w/*** stable  intervals     *** No AF burden by symptoms  Labs scheduled with his PMD next week   2. HTN     ***  Disposition: ***   Current medicines are reviewed at length  with the patient today.  The patient did not have any concerns regarding medicines.  Norma Fredrickson, PA-C 03/11/2023 11:04 AM     [facility] [address] [place] [address] [phone] (office)  [phone] (fax)

## 2023-03-14 ENCOUNTER — Ambulatory Visit: Payer: Medicare PPO | Attending: Physician Assistant | Admitting: Physician Assistant

## 2023-03-14 ENCOUNTER — Encounter: Payer: Self-pay | Admitting: Physician Assistant

## 2023-03-14 VITALS — BP 144/68 | HR 53 | Ht 71.0 in | Wt 191.8 lb

## 2023-03-14 DIAGNOSIS — Z5181 Encounter for therapeutic drug level monitoring: Secondary | ICD-10-CM

## 2023-03-14 DIAGNOSIS — Z79899 Other long term (current) drug therapy: Secondary | ICD-10-CM | POA: Diagnosis not present

## 2023-03-14 DIAGNOSIS — D6869 Other thrombophilia: Secondary | ICD-10-CM

## 2023-03-14 DIAGNOSIS — I480 Paroxysmal atrial fibrillation: Secondary | ICD-10-CM | POA: Diagnosis not present

## 2023-03-14 DIAGNOSIS — I10 Essential (primary) hypertension: Secondary | ICD-10-CM

## 2023-03-14 MED ORDER — FLECAINIDE ACETATE 50 MG PO TABS: 50.0000 mg | ORAL_TABLET | Freq: Two times a day (BID) | ORAL | 0 refills | Status: DC

## 2023-03-14 MED ORDER — APIXABAN 5 MG PO TABS: 5.0000 mg | ORAL_TABLET | Freq: Two times a day (BID) | ORAL | 3 refills | Status: AC

## 2023-03-14 MED ORDER — LOSARTAN POTASSIUM 100 MG PO TABS: 100.0000 mg | ORAL_TABLET | Freq: Every day | ORAL | 3 refills | Status: AC

## 2023-03-14 MED ORDER — METOPROLOL SUCCINATE ER 50 MG PO TB24: 50.0000 mg | ORAL_TABLET | Freq: Every day | ORAL | 3 refills | Status: DC

## 2023-03-14 NOTE — Patient Instructions (Signed)
 Medication Instructions:  No Changes *If you need a refill on your cardiac medications before your next appointment, please call your pharmacy*   Lab Work: No Labs If you have labs (blood work) drawn today and your tests are completely normal, you will receive your results only by: MyChart Message (if you have MyChart) OR A paper copy in the mail If you have any lab test that is abnormal or we need to change your treatment, we will call you to review the results.   Testing/Procedures: No Testing   Follow-Up: At [facility], you and your health needs are our priority.  As part of our continuing mission to provide you with exceptional heart care, we have created designated Provider Care Teams.  These Care Teams include your primary Cardiologist (physician) and Advanced Practice Providers (APPs -  Physician Assistants and Nurse Practitioners) who all work together to provide you with the care you need, when you need it.  We recommend signing up for the patient portal called "MyChart".  Sign up information is provided on this After Visit Summary.  MyChart is used to connect with patients for Virtual Visits (Telemedicine).  Patients are able to view lab/test results, encounter notes, upcoming appointments, etc.  Non-urgent messages can be sent to your provider as well.   To learn more about what you can do with MyChart, go to [email or web address].    Your next appointment:   1 year(s)  Provider:   Loman Brooklyn, MD

## 2023-05-17 DIAGNOSIS — N182 Chronic kidney disease, stage 2 (mild): Secondary | ICD-10-CM | POA: Diagnosis not present

## 2023-05-17 DIAGNOSIS — R7303 Prediabetes: Secondary | ICD-10-CM | POA: Diagnosis not present

## 2023-05-17 DIAGNOSIS — R5383 Other fatigue: Secondary | ICD-10-CM | POA: Diagnosis not present

## 2023-05-17 DIAGNOSIS — Z125 Encounter for screening for malignant neoplasm of prostate: Secondary | ICD-10-CM | POA: Diagnosis not present

## 2023-05-17 DIAGNOSIS — I10 Essential (primary) hypertension: Secondary | ICD-10-CM | POA: Diagnosis not present

## 2023-05-17 DIAGNOSIS — R972 Elevated prostate specific antigen [PSA]: Secondary | ICD-10-CM | POA: Diagnosis not present

## 2023-05-24 DIAGNOSIS — Z0000 Encounter for general adult medical examination without abnormal findings: Secondary | ICD-10-CM | POA: Diagnosis not present

## 2023-05-24 DIAGNOSIS — J301 Allergic rhinitis due to pollen: Secondary | ICD-10-CM | POA: Diagnosis not present

## 2023-05-24 DIAGNOSIS — I10 Essential (primary) hypertension: Secondary | ICD-10-CM | POA: Diagnosis not present

## 2023-05-24 DIAGNOSIS — I4821 Permanent atrial fibrillation: Secondary | ICD-10-CM | POA: Diagnosis not present

## 2023-05-24 DIAGNOSIS — N182 Chronic kidney disease, stage 2 (mild): Secondary | ICD-10-CM | POA: Diagnosis not present

## 2023-09-02 ENCOUNTER — Other Ambulatory Visit: Payer: Self-pay | Admitting: Physician Assistant

## 2023-09-04 ENCOUNTER — Telehealth: Payer: Self-pay | Admitting: Physician Assistant

## 2023-09-04 MED ORDER — FLECAINIDE ACETATE 50 MG PO TABS: 50.0000 mg | ORAL_TABLET | Freq: Two times a day (BID) | ORAL | 3 refills | Status: DC

## 2023-09-04 NOTE — Telephone Encounter (Signed)
*  STAT* If patient is at the pharmacy, call can be transferred to refill team.   1. Which medications need to be refilled? (please list name of each medication and dose if known) flecainide  (TAMBOCOR ) 50 MG tablet    2. Would you like to learn more about the convenience, safety, & potential cost savings by using the [facility]? No   3. Are you open to using the [facility] (Type [facility].) No   4. Which pharmacy/location (including street and city if local pharmacy) is medication to be sent to? [facility] - , [place] - [address]    5. Do they need a 30 day or 90 day supply? 90 day  Pt was seen 2/25 not due to f/u until next year. Pt is out of medication

## 2023-10-17 DIAGNOSIS — H18513 Endothelial corneal dystrophy, bilateral: Secondary | ICD-10-CM | POA: Diagnosis not present

## 2023-10-17 DIAGNOSIS — H524 Presbyopia: Secondary | ICD-10-CM | POA: Diagnosis not present

## 2023-10-17 DIAGNOSIS — Z961 Presence of intraocular lens: Secondary | ICD-10-CM | POA: Diagnosis not present

## 2024-02-12 ENCOUNTER — Encounter (HOSPITAL_COMMUNITY): Payer: Self-pay | Admitting: Nurse Practitioner

## 2024-02-12 ENCOUNTER — Ambulatory Visit (HOSPITAL_COMMUNITY)
Admission: RE | Admit: 2024-02-12 | Discharge: 2024-02-12 | Disposition: A | Discharge status: Home / Self Care | Source: Ambulatory Visit | Attending: Nurse Practitioner | Admitting: Nurse Practitioner

## 2024-02-12 ENCOUNTER — Encounter: Payer: Self-pay | Admitting: Cardiology

## 2024-02-12 VITALS — BP 114/94 | HR 124 | Ht 71.0 in | Wt 194.8 lb

## 2024-02-12 DIAGNOSIS — I480 Paroxysmal atrial fibrillation: Secondary | ICD-10-CM

## 2024-02-12 DIAGNOSIS — Z79899 Other long term (current) drug therapy: Secondary | ICD-10-CM

## 2024-02-12 DIAGNOSIS — D6869 Other thrombophilia: Secondary | ICD-10-CM | POA: Diagnosis not present

## 2024-02-12 DIAGNOSIS — Z5181 Encounter for therapeutic drug level monitoring: Secondary | ICD-10-CM | POA: Diagnosis not present

## 2024-02-12 MED ORDER — FLECAINIDE ACETATE 100 MG PO TABS: 100.0000 mg | ORAL_TABLET | Freq: Two times a day (BID) | ORAL | 1 refills | Status: AC

## 2024-02-12 MED ORDER — METOPROLOL SUCCINATE ER 50 MG PO TB24: ORAL_TABLET | ORAL | 1 refills | Status: AC

## 2024-02-12 NOTE — Progress Notes (Signed)
 "  Primary Care Physician: Alvin Lombard, MD Primary Cardiologist: None Electrophysiologist: Alvin Gladis Norton, MD  Referring Physician: Soyla Gladis Norton, MD  Alvin Morrison is Morrison 69 y.o. male with Morrison history of paroxysmal Morrison-fib, HTN, mild CM who presents for follow up in the [facility].  The patient was initially diagnosed with atrial fibrillation in 2012 and spontaneously converted.  2D echo was completed at that time showing reduced EF of 45 to 50%.  He underwent ischemic evaluation with stress Myoview  that was normal.  He went back into atrial fibrillation on 04/2012 and was started on Eliquis  with TEE/DCCV completed and EF noted to be 50-55%. He was  treated with Multaq  which was discontinued after no recurrence of AF per patient and MD choice.  He experienced recurrence of AF in 2016  and was started on flecainide  in 2016 and underwent Morrison ETT that showed no evidence of ischemia.  He was previously followed by Dr. Kelsie and is currently followed by Dr. Norton with no adjustment needed to flecainide  and maintaining sinus rhythm.  He was last seen by Alvin Arthur, PA on 03/14/2023 and was doing well with no AF burden symptoms noted.  He contacted our office on 02/12/2024 with complaint of atrial fibrillation.  He noted symptoms of chest tightness and fluttering and has been compliant with flecainide  and Eliquis  as well as metoprolol .  He also noted reduced energy level and presents today for follow-up and to evaluate symptoms.  Alvin Morrison presents today for evaluation of recent recurrence of atrial fibrillation.He has experienced an increase in heart rate and blood pressure, with his heart rate reaching 114 bpm, significantly higher than his usual rate in the fifties. His blood pressure was initially recorded at 134/100 mmHg, later at 145/100 mmHg, and has settled at 114/94 mmHg today. He has Morrison history of atrial fibrillation, which recurs approximately every two years.  He has previously undergone cardioversion and has been managed with flecainide  for about ten years, taking it twice daily. He has not missed any doses of his anticoagulant medication. He attributes the recent episode of atrial fibrillation to physical exertion while clearing snow and ice from his driveway. No dizziness upon standing, swelling in the ankles, or missed doses of medication. He monitors his heart rate and blood pressure at home using Morrison battery-operated device.He engages in daily hiking when weather permits.  We discussed treatment options such as referral for ablation, DCCV to restore rhythm and continuation of medications.  He politely declines referral for AF ablation and does not want to pursue DCCV at this time.  We Alvin move forward with increasing his flecainide  at this time and patient Alvin follow-up with Dr. Norton as scheduled in February.  Today, he denies symptoms of palpitations, chest pain, shortness of breath, orthopnea, PND, lower extremity edema, dizziness, presyncope, syncope, snoring, daytime somnolence, bleeding, or neurologic sequela. The patient is tolerating medications without difficulties and is otherwise without complaint today.   Discussed the use of AI scribe software for clinical note transcription with the patient, who gave verbal consent to proceed.   Atrial Fibrillation Management history: History of Sleep Apnea none History of alcohol use occasional History of early familial atrial fibrillation or other arrhythmias  Previous antiarrhythmic drugs: Multaq  discontinue no adverse reaction noted, Flecainide  Previous cardioversions: TEE/DCCV 2014 Previous ablations: None Anticoagulation history: Eliquis   ROS- All systems are reviewed and negative except as per the HPI above.  Past Medical History:  Diagnosis Date  Allergy    AR (allergic rhinitis)    Atrial fibrillation with RVR (HCC) 07/03/2010   Cardiomyopathy (HCC) 08/08/2010   Congenital cavus  deformity of both feet 02/09/2019   Eczema    Elevated glucose 10/29/2019   Essential hypertension 07/25/2006   Qualifier: Diagnosis of  By: Alvin Morrison    Fracture of left humerus, medial epicondyle     med. epi. not specified    GERD (gastroesophageal reflux disease)    HTN (hypertension)    Paroxysmal atrial fibrillation (HCC)    chads2vasc score is 1   VIRAL URI 05/22/2007   Qualifier: Diagnosis of  By: Krystal MD, Alvin LABOR    Past Surgical History:  Procedure Laterality Date   APPENDECTOMY     CARDIOVERSION N/Morrison 04/29/2012   Procedure: CARDIOVERSION;  Surgeon: Alvin GORMAN Shuck, MD;  Location: [facility] ENDOSCOPY;  Service: Cardiovascular;  Laterality: N/Morrison;   COLONOSCOPY  01/26/05   left arm sx     humerus sx plate and screws   TEE WITHOUT CARDIOVERSION N/Morrison 04/29/2012   Procedure: TRANSESOPHAGEAL ECHOCARDIOGRAM (TEE);  Surgeon: Alvin GORMAN Shuck, MD;  Location: [facility] ENDOSCOPY;  Service: Cardiovascular;  Laterality: N/Morrison;   Hydrochlorothiazide Current Outpatient Medications  Medication Sig Dispense Refill   amLODipine  (NORVASC ) 5 MG tablet TAKE 1 TABLET (5 MG TOTAL) BY MOUTH DAILY. 30 tablet 2   apixaban  (ELIQUIS ) 5 MG TABS tablet Take 1 tablet (5 mg total) by mouth 2 (two) times daily. 180 tablet 3   azelastine  (ASTELIN ) 0.1 % nasal spray PLACE 1 SPRAY INTO BOTH NOSTRILS 2 TIMES DAILY AS DIRECTED 30 mL 12   losartan  (COZAAR ) 100 MG tablet Take 1 tablet (100 mg total) by mouth daily. 90 tablet 3   montelukast  (SINGULAIR ) 10 MG tablet TAKE 1 TABLET BY MOUTH AT BEDTIME. 90 tablet 0   Multiple Vitamins-Minerals (ONE-Morrison-DAY MENS 50+ ADVANTAGE PO) Take 1 capsule by mouth daily.     omeprazole  (PRILOSEC ) 20 MG capsule TAKE 1 CAPSULE BY MOUTH DAILY AS NEEDED (HEARTBURN OR ACID REFLUX). 90 capsule 1   PRESCRIPTION MEDICATION as directed. prednisolone eye drops     Vitamin D , Ergocalciferol , (DRISDOL ) 1.25 MG (50000 UNIT) CAPS capsule Take 1 capsule (50,000 Units total) by mouth every 7 (seven) days. 5  capsule 5   zinc gluconate 50 MG tablet Take 50 mg by mouth daily.     flecainide  (TAMBOCOR ) 100 MG tablet Take 1 tablet (100 mg total) by mouth 2 (two) times daily. 180 tablet 1   metoprolol  succinate (TOPROL -XL) 50 MG 24 hr tablet Take 1 tablet (50 mg total) by mouth daily. May also take 0.5-1 tablets (25-50 mg total) as needed (HR>115). 270 tablet 1   No current facility-administered medications for this encounter.    Physical Exam: BP (!) 114/94   Pulse (!) 124   Ht 5' 11 (1.803 m)   Wt 88.4 kg   BMI 27.17 kg/m   GEN: Well nourished, well developed in no acute distress NECK: No JVD; No carotid bruits CARDIAC: Irregularly irregular rate and rhythm, no murmurs, rubs, gallops RESPIRATORY:  Clear to auscultation without rales, wheezing or rhonchi  ABDOMEN: Soft, non-tender, non-distended EXTREMITIES:  No edema; No deformity   Wt Readings from Last 3 Encounters:  02/12/24 88.4 kg  03/14/23 87 kg  03/13/22 86.6 kg    Lab Results  Component Value Date   TSH 1.33 01/22/2017    EKG Interpretation Date/Time:  Wednesday February 12 2024 14:35:57 EST Ventricular Rate:  124  PR Interval:  200 QRS Duration:  108 QT Interval:  310 QTC Calculation: 445 R Axis:   96  Text Interpretation: Sinus tachycardia Rightward axis Abnormal QRS-T angle, consider primary T wave abnormality Abnormal ECG When compared with ECG of 14-Mar-2023 10:06, Vent. rate has increased BY  71 BPM Confirmed by Wyn Manus [phone]) on 02/12/2024 2:39:41 PM   Echo Completed 04/29/2012: Left ventricle: The cavity size was normal. Wall thickness    was normal. Systolic function was low normal to mildly    decreased. The estimated ejection fraction was in the    range of 50% to 55%. Wall motion was normal; there were no    regional wall motion abnormalities.  - Aortic valve: There was no stenosis. Trivial    regurgitation.  - Aorta: Normal caliber, minimal plaque.  - Mitral valve: Trivial regurgitation.  - Left  atrium: The atrium was mildly to moderately dilated.    No evidence of thrombus in the atrial cavity or appendage.  - Right ventricle: The cavity size was normal.  - Right atrium: No evidence of thrombus in the atrial cavity    or appendage.  - Atrial septum: No defect or patent foramen ovale was    identified. Echo contrast study showed no right-to-left    atrial level shunt, at baseline or with provocation.  - Tricuspid valve: Peak RV-RA gradient: 14mm Hg (S).   CHA2DS2-VASc Score = 2  The patient's score is based upon: CHF History: 0 HTN History: 1 Diabetes History: 0 Stroke History: 0 Vascular Disease History: 0 Age Score: 1 Gender Score: 0       ASSESSMENT AND PLAN: Paroxysmal Atrial Fibrillation (ICD10:  I48.0) The patient's CHA2DS2-VASc score is 2, indicating Morrison 2.2% annual risk of stroke.   -Diagnosed with AF in 2012 and managed with flecainide  over 10 years with recent occurrence. Recurrent episodes likely triggered by exertion and stress. Prefers non-invasive management. Cardioversion more effective but prefers medication due to past success.  - Increased flecainide  to 100 mg twice daily. - Scheduled follow-up EKG in one week to assess heart rhythm. - Continue metoprolol  50 mg, with as needed 25-50 mg for elevated heart rate - Recommended obtaining Morrison [employer] or using his [employer] for home heart rhythm monitoring. - Advised to seek emergency care if experiencing severe symptoms or if heart rate becomes significantly elevated  HTN: BP well controlled. Continue current antihypertensive regimen.   High Risk Medication Monitoring (ICD 10: [id number]) Patient requires ongoing monitoring for anti-arrhythmic medication which has the potential to cause life threatening arrhythmias. Intervals on ECG acceptable for flecainide  monitoring.    We Alvin increase flecainide  to 100 mg twice daily and patient Alvin return in 1 week for EKG visit.  Signed,  Wyn Raddle, Manus Shove, NP    02/12/2024 3:39 PM    Follow up with the AF Clinic in 1 week for EKG and with Dr. Inocencio as scheduled    "

## 2024-02-14 ENCOUNTER — Encounter: Payer: Self-pay | Admitting: Cardiology

## 2024-02-14 NOTE — Telephone Encounter (Signed)
 Called and spoke with patient.  Advised typically EP is not following BPs.  Aware we will address if BP is high in the office, but normally pt would need to follow up with PCP about this.  Advised pt to do so and if issues getting with PCP over weekend, and BP remain elevated/concerning to call our office if needed. Aware will forward to Afib clinic to review, but unlikely different advisement will occur. Discussed chest tightness he reported.  He also states he was shoveling Monday, tightness occurred afterwards.  Understands this discomfort could be r/t to afib and/or shoveling.  Discussed when to go to ED if persists.   Patient verbalized understanding and agreeable to plan.

## 2024-02-17 ENCOUNTER — Telehealth: Payer: Self-pay | Admitting: Cardiology

## 2024-02-17 NOTE — Progress Notes (Incomplete)
 Discussed the use of AI scribe software for clinical note transcription with the patient, who gave verbal consent to proceed.  History of Present Illness

## 2024-02-19 ENCOUNTER — Encounter (HOSPITAL_COMMUNITY): Payer: Self-pay | Admitting: Nurse Practitioner

## 2024-02-19 ENCOUNTER — Ambulatory Visit (HOSPITAL_COMMUNITY)
Admission: RE | Admit: 2024-02-19 | Discharge: 2024-02-19 | Attending: Nurse Practitioner | Admitting: Nurse Practitioner

## 2024-02-19 ENCOUNTER — Ambulatory Visit (HOSPITAL_COMMUNITY): Admitting: Nurse Practitioner

## 2024-02-19 ENCOUNTER — Other Ambulatory Visit (HOSPITAL_COMMUNITY): Payer: Self-pay

## 2024-02-19 VITALS — BP 142/98 | HR 108 | Ht 71.0 in | Wt 196.2 lb

## 2024-02-19 DIAGNOSIS — Z5181 Encounter for therapeutic drug level monitoring: Secondary | ICD-10-CM

## 2024-02-19 DIAGNOSIS — I10 Essential (primary) hypertension: Secondary | ICD-10-CM | POA: Diagnosis not present

## 2024-02-19 DIAGNOSIS — D6869 Other thrombophilia: Secondary | ICD-10-CM

## 2024-02-19 DIAGNOSIS — Z79899 Other long term (current) drug therapy: Secondary | ICD-10-CM | POA: Diagnosis not present

## 2024-02-19 DIAGNOSIS — I480 Paroxysmal atrial fibrillation: Secondary | ICD-10-CM | POA: Diagnosis not present

## 2024-02-19 MED ORDER — AMLODIPINE BESYLATE 10 MG PO TABS: 10.0000 mg | ORAL_TABLET | Freq: Every day | ORAL | 3 refills | Status: AC

## 2024-02-19 NOTE — Patient Instructions (Addendum)
 Take metoprolol  50mg  once a day  -- may take an extra tablet daily AS NEEDED for heart rates staying over 115   Norvasc  (Amlodipine ) increase to 10mg  once  a day   Echocardiogram -- scheduling will call once insurance authorization received.

## 2024-02-21 ENCOUNTER — Ambulatory Visit (HOSPITAL_COMMUNITY): Admission: RE | Admit: 2024-02-21

## 2024-02-21 DIAGNOSIS — I480 Paroxysmal atrial fibrillation: Secondary | ICD-10-CM

## 2024-02-21 LAB — ECHOCARDIOGRAM COMPLETE
Area-P 1/2: 3.93 cm2
Calc EF: 41.4 %
P 1/2 time: 339 ms
S' Lateral: 3.2 cm
Single Plane A2C EF: 44.5 %
Single Plane A4C EF: 40.9 %

## 2024-03-13 ENCOUNTER — Ambulatory Visit: Admitting: Cardiology

## 2024-05-12 ENCOUNTER — Ambulatory Visit (HOSPITAL_COMMUNITY): Admitting: Nurse Practitioner
